# Patient Record
Sex: Male | Born: 1935 | Race: White | Hispanic: No | Marital: Married | Smoking: Former smoker
Health system: Southern US, Community
[De-identification: ages and names within clinical notes are randomized; demographics above are authoritative.]

## PROBLEM LIST (undated history)

## (undated) DIAGNOSIS — E7800 Pure hypercholesterolemia, unspecified: Secondary | ICD-10-CM

## (undated) DIAGNOSIS — G4752 REM sleep behavior disorder: Secondary | ICD-10-CM

## (undated) DIAGNOSIS — G20A1 Parkinson's disease without dyskinesia, without mention of fluctuations: Secondary | ICD-10-CM

## (undated) DIAGNOSIS — J449 Chronic obstructive pulmonary disease, unspecified: Secondary | ICD-10-CM

## (undated) DIAGNOSIS — N400 Enlarged prostate without lower urinary tract symptoms: Secondary | ICD-10-CM

## (undated) DIAGNOSIS — I10 Essential (primary) hypertension: Secondary | ICD-10-CM

## (undated) DIAGNOSIS — J4489 Other specified chronic obstructive pulmonary disease: Secondary | ICD-10-CM

## (undated) DIAGNOSIS — R413 Other amnesia: Secondary | ICD-10-CM

## (undated) DIAGNOSIS — Z960 Presence of urogenital implants: Secondary | ICD-10-CM

## (undated) DIAGNOSIS — Z978 Presence of other specified devices: Secondary | ICD-10-CM

## (undated) DIAGNOSIS — R0602 Shortness of breath: Secondary | ICD-10-CM

## (undated) DIAGNOSIS — G20 Parkinson's disease: Secondary | ICD-10-CM

## (undated) DIAGNOSIS — K219 Gastro-esophageal reflux disease without esophagitis: Secondary | ICD-10-CM

## (undated) DIAGNOSIS — E782 Mixed hyperlipidemia: Secondary | ICD-10-CM

## (undated) DIAGNOSIS — E669 Obesity, unspecified: Secondary | ICD-10-CM

## (undated) HISTORY — DX: Parkinson's disease without dyskinesia, without mention of fluctuations: G20.A1

## (undated) HISTORY — DX: Parkinson's disease: G20

## (undated) HISTORY — DX: REM sleep behavior disorder: G47.52

## (undated) HISTORY — DX: Obesity, unspecified: E66.9

## (undated) HISTORY — DX: Benign prostatic hyperplasia without lower urinary tract symptoms: N40.0

## (undated) HISTORY — DX: Gastro-esophageal reflux disease without esophagitis: K21.9

## (undated) HISTORY — PX: TONSILLECTOMY: SUR1361

## (undated) HISTORY — DX: Pure hypercholesterolemia, unspecified: E78.00

## (undated) HISTORY — DX: Chronic obstructive pulmonary disease, unspecified: J44.9

## (undated) HISTORY — DX: Mixed hyperlipidemia: E78.2

## (undated) HISTORY — DX: Other specified chronic obstructive pulmonary disease: J44.89

## (undated) HISTORY — DX: Essential (primary) hypertension: I10

## (undated) HISTORY — PX: HEMORRHOID SURGERY: SHX153

## (undated) HISTORY — DX: Other amnesia: R41.3

---

## 2000-11-13 ENCOUNTER — Encounter: Admission: RE | Admit: 2000-11-13 | Discharge: 2000-11-13 | Payer: Self-pay | Admitting: Family Medicine

## 2000-11-13 ENCOUNTER — Encounter: Payer: Self-pay | Admitting: Family Medicine

## 2001-11-12 ENCOUNTER — Encounter: Payer: Self-pay | Admitting: Family Medicine

## 2001-11-12 ENCOUNTER — Encounter: Admission: RE | Admit: 2001-11-12 | Discharge: 2001-11-12 | Payer: Self-pay | Admitting: Family Medicine

## 2003-07-28 ENCOUNTER — Encounter: Admission: RE | Admit: 2003-07-28 | Discharge: 2003-07-28 | Payer: Self-pay | Admitting: Family Medicine

## 2004-07-05 ENCOUNTER — Ambulatory Visit: Payer: Self-pay | Admitting: Internal Medicine

## 2004-07-20 ENCOUNTER — Ambulatory Visit: Payer: Self-pay | Admitting: Pulmonary Disease

## 2004-07-30 ENCOUNTER — Ambulatory Visit: Payer: Self-pay | Admitting: Pulmonary Disease

## 2004-08-29 ENCOUNTER — Ambulatory Visit: Payer: Self-pay | Admitting: Pulmonary Disease

## 2004-11-22 ENCOUNTER — Ambulatory Visit: Payer: Self-pay | Admitting: Family Medicine

## 2004-12-06 ENCOUNTER — Ambulatory Visit: Payer: Self-pay | Admitting: Critical Care Medicine

## 2004-12-14 ENCOUNTER — Ambulatory Visit: Payer: Self-pay | Admitting: Internal Medicine

## 2004-12-18 ENCOUNTER — Ambulatory Visit: Payer: Self-pay | Admitting: Gastroenterology

## 2005-01-02 ENCOUNTER — Ambulatory Visit: Payer: Self-pay | Admitting: Gastroenterology

## 2005-01-02 ENCOUNTER — Encounter (INDEPENDENT_AMBULATORY_CARE_PROVIDER_SITE_OTHER): Payer: Self-pay | Admitting: *Deleted

## 2005-01-03 ENCOUNTER — Ambulatory Visit: Payer: Self-pay | Admitting: Pulmonary Disease

## 2005-01-17 ENCOUNTER — Ambulatory Visit: Payer: Self-pay | Admitting: Pulmonary Disease

## 2005-01-25 ENCOUNTER — Ambulatory Visit: Payer: Self-pay | Admitting: Family Medicine

## 2005-01-29 ENCOUNTER — Ambulatory Visit: Payer: Self-pay | Admitting: Pulmonary Disease

## 2005-03-01 ENCOUNTER — Ambulatory Visit: Payer: Self-pay | Admitting: Pulmonary Disease

## 2005-07-12 ENCOUNTER — Ambulatory Visit: Payer: Self-pay | Admitting: Pulmonary Disease

## 2005-08-13 ENCOUNTER — Ambulatory Visit: Payer: Self-pay | Admitting: Internal Medicine

## 2005-08-27 ENCOUNTER — Ambulatory Visit: Payer: Self-pay | Admitting: Pulmonary Disease

## 2005-10-29 ENCOUNTER — Ambulatory Visit: Payer: Self-pay | Admitting: Pulmonary Disease

## 2005-12-05 ENCOUNTER — Ambulatory Visit: Payer: Self-pay | Admitting: Internal Medicine

## 2005-12-06 ENCOUNTER — Ambulatory Visit: Payer: Self-pay | Admitting: Internal Medicine

## 2005-12-19 ENCOUNTER — Encounter: Admission: RE | Admit: 2005-12-19 | Discharge: 2005-12-19 | Payer: Self-pay | Admitting: Family Medicine

## 2005-12-19 ENCOUNTER — Ambulatory Visit: Payer: Self-pay | Admitting: Family Medicine

## 2006-01-03 ENCOUNTER — Ambulatory Visit: Payer: Self-pay | Admitting: Critical Care Medicine

## 2006-01-28 ENCOUNTER — Ambulatory Visit: Payer: Self-pay | Admitting: Family Medicine

## 2006-02-05 ENCOUNTER — Encounter: Admission: RE | Admit: 2006-02-05 | Discharge: 2006-02-05 | Payer: Self-pay | Admitting: Neurology

## 2006-06-10 ENCOUNTER — Ambulatory Visit: Payer: Self-pay | Admitting: Internal Medicine

## 2006-06-28 ENCOUNTER — Emergency Department (HOSPITAL_COMMUNITY): Admission: EM | Admit: 2006-06-28 | Discharge: 2006-06-28 | Payer: Self-pay | Admitting: Emergency Medicine

## 2006-07-05 ENCOUNTER — Emergency Department (HOSPITAL_COMMUNITY): Admission: EM | Admit: 2006-07-05 | Discharge: 2006-07-05 | Payer: Self-pay | Admitting: Emergency Medicine

## 2006-07-07 ENCOUNTER — Ambulatory Visit: Payer: Self-pay | Admitting: Internal Medicine

## 2006-07-14 ENCOUNTER — Ambulatory Visit: Payer: Self-pay | Admitting: Internal Medicine

## 2006-07-30 ENCOUNTER — Ambulatory Visit: Payer: Self-pay | Admitting: Pulmonary Disease

## 2006-09-10 ENCOUNTER — Ambulatory Visit: Payer: Self-pay | Admitting: Internal Medicine

## 2006-12-11 DIAGNOSIS — J449 Chronic obstructive pulmonary disease, unspecified: Secondary | ICD-10-CM

## 2006-12-11 DIAGNOSIS — N400 Enlarged prostate without lower urinary tract symptoms: Secondary | ICD-10-CM | POA: Insufficient documentation

## 2006-12-11 DIAGNOSIS — J45909 Unspecified asthma, uncomplicated: Secondary | ICD-10-CM | POA: Insufficient documentation

## 2007-01-14 ENCOUNTER — Ambulatory Visit: Payer: Self-pay | Admitting: Family Medicine

## 2007-01-14 DIAGNOSIS — E782 Mixed hyperlipidemia: Secondary | ICD-10-CM

## 2007-01-14 DIAGNOSIS — G20 Parkinson's disease: Secondary | ICD-10-CM | POA: Insufficient documentation

## 2007-01-14 DIAGNOSIS — I10 Essential (primary) hypertension: Secondary | ICD-10-CM | POA: Insufficient documentation

## 2007-01-14 LAB — CONVERTED CEMR LAB
Bilirubin, Direct: 0.2 mg/dL (ref 0.0–0.3)
Eosinophils Absolute: 0.1 10*3/uL (ref 0.0–0.6)
Eosinophils Relative: 1.2 % (ref 0.0–5.0)
GFR calc Af Amer: 76 mL/min
GFR calc non Af Amer: 62 mL/min
Glucose, Bld: 109 mg/dL — ABNORMAL HIGH (ref 70–99)
HCT: 47.5 % (ref 39.0–52.0)
Lymphocytes Relative: 15.5 % (ref 12.0–46.0)
MCV: 96.9 fL (ref 78.0–100.0)
Monocytes Absolute: 0.4 10*3/uL (ref 0.2–0.7)
Neutro Abs: 5.2 10*3/uL (ref 1.4–7.7)
Neutrophils Relative %: 76.9 % (ref 43.0–77.0)
PSA: 0.59 ng/mL (ref 0.10–4.00)
Platelets: 183 10*3/uL (ref 150–400)
Potassium: 4.5 meq/L (ref 3.5–5.1)
Sodium: 146 meq/L — ABNORMAL HIGH (ref 135–145)
Triglycerides: 92 mg/dL (ref 0–149)
WBC: 6.8 10*3/uL (ref 4.5–10.5)

## 2007-02-21 DIAGNOSIS — K219 Gastro-esophageal reflux disease without esophagitis: Secondary | ICD-10-CM | POA: Insufficient documentation

## 2007-02-21 DIAGNOSIS — E7800 Pure hypercholesterolemia, unspecified: Secondary | ICD-10-CM | POA: Insufficient documentation

## 2007-03-09 ENCOUNTER — Telehealth: Payer: Self-pay | Admitting: Family Medicine

## 2007-03-12 ENCOUNTER — Telehealth: Payer: Self-pay | Admitting: Family Medicine

## 2007-03-12 ENCOUNTER — Telehealth: Payer: Self-pay | Admitting: Internal Medicine

## 2007-08-19 ENCOUNTER — Encounter: Payer: Self-pay | Admitting: Pulmonary Disease

## 2007-08-19 ENCOUNTER — Ambulatory Visit: Payer: Self-pay | Admitting: Internal Medicine

## 2007-09-02 ENCOUNTER — Telehealth: Payer: Self-pay | Admitting: Family Medicine

## 2007-09-02 ENCOUNTER — Telehealth (INDEPENDENT_AMBULATORY_CARE_PROVIDER_SITE_OTHER): Payer: Self-pay | Admitting: *Deleted

## 2007-09-03 ENCOUNTER — Telehealth: Payer: Self-pay | Admitting: Internal Medicine

## 2007-09-05 ENCOUNTER — Encounter: Payer: Self-pay | Admitting: Internal Medicine

## 2007-11-11 ENCOUNTER — Ambulatory Visit: Payer: Self-pay | Admitting: Internal Medicine

## 2008-01-25 ENCOUNTER — Telehealth (INDEPENDENT_AMBULATORY_CARE_PROVIDER_SITE_OTHER): Payer: Self-pay | Admitting: *Deleted

## 2008-02-02 ENCOUNTER — Encounter: Payer: Self-pay | Admitting: Family Medicine

## 2008-02-03 ENCOUNTER — Ambulatory Visit: Payer: Self-pay | Admitting: Family Medicine

## 2008-02-03 LAB — CONVERTED CEMR LAB
Bilirubin Urine: NEGATIVE
Glucose, Urine, Semiquant: NEGATIVE
WBC Urine, dipstick: NEGATIVE
pH: 7.5

## 2008-02-04 LAB — CONVERTED CEMR LAB
ALT: 12 units/L (ref 0–53)
AST: 24 units/L (ref 0–37)
Albumin: 4 g/dL (ref 3.5–5.2)
BUN: 16 mg/dL (ref 6–23)
Basophils Absolute: 0 10*3/uL (ref 0.0–0.1)
Basophils Relative: 0.1 % (ref 0.0–3.0)
CO2: 36 meq/L — ABNORMAL HIGH (ref 19–32)
Calcium: 9.9 mg/dL (ref 8.4–10.5)
Chloride: 102 meq/L (ref 96–112)
Cholesterol: 167 mg/dL (ref 0–200)
Creatinine, Ser: 1.1 mg/dL (ref 0.4–1.5)
Eosinophils Relative: 1.1 % (ref 0.0–5.0)
Glucose, Bld: 106 mg/dL — ABNORMAL HIGH (ref 70–99)
Hemoglobin: 17.1 g/dL — ABNORMAL HIGH (ref 13.0–17.0)
LDL Cholesterol: 102 mg/dL — ABNORMAL HIGH (ref 0–99)
Lymphocytes Relative: 14.2 % (ref 12.0–46.0)
MCHC: 34.6 g/dL (ref 30.0–36.0)
MCV: 96.1 fL (ref 78.0–100.0)
Neutro Abs: 5.3 10*3/uL (ref 1.4–7.7)
PSA: 0.93 ng/mL (ref 0.10–4.00)
RBC: 5.13 M/uL (ref 4.22–5.81)
TSH: 3.05 microintl units/mL (ref 0.35–5.50)
Total Bilirubin: 1.4 mg/dL — ABNORMAL HIGH (ref 0.3–1.2)
Total Protein: 6.8 g/dL (ref 6.0–8.3)
WBC: 6.8 10*3/uL (ref 4.5–10.5)

## 2008-02-10 ENCOUNTER — Telehealth: Payer: Self-pay | Admitting: Internal Medicine

## 2008-03-02 ENCOUNTER — Telehealth: Payer: Self-pay | Admitting: *Deleted

## 2008-05-02 ENCOUNTER — Telehealth: Payer: Self-pay | Admitting: *Deleted

## 2008-09-14 ENCOUNTER — Telehealth: Payer: Self-pay | Admitting: Family Medicine

## 2008-09-14 ENCOUNTER — Telehealth (INDEPENDENT_AMBULATORY_CARE_PROVIDER_SITE_OTHER): Payer: Self-pay | Admitting: *Deleted

## 2008-09-27 ENCOUNTER — Telehealth (INDEPENDENT_AMBULATORY_CARE_PROVIDER_SITE_OTHER): Payer: Self-pay | Admitting: *Deleted

## 2008-09-29 ENCOUNTER — Telehealth (INDEPENDENT_AMBULATORY_CARE_PROVIDER_SITE_OTHER): Payer: Self-pay | Admitting: *Deleted

## 2008-10-21 ENCOUNTER — Ambulatory Visit: Payer: Self-pay | Admitting: Internal Medicine

## 2009-01-03 ENCOUNTER — Telehealth: Payer: Self-pay | Admitting: Family Medicine

## 2009-01-12 ENCOUNTER — Encounter: Payer: Self-pay | Admitting: *Deleted

## 2009-01-12 ENCOUNTER — Telehealth: Payer: Self-pay | Admitting: Internal Medicine

## 2009-01-17 ENCOUNTER — Telehealth (INDEPENDENT_AMBULATORY_CARE_PROVIDER_SITE_OTHER): Payer: Self-pay | Admitting: *Deleted

## 2009-02-23 ENCOUNTER — Ambulatory Visit: Payer: Self-pay | Admitting: Family Medicine

## 2009-03-06 ENCOUNTER — Telehealth: Payer: Self-pay | Admitting: Family Medicine

## 2009-03-30 ENCOUNTER — Ambulatory Visit: Payer: Self-pay | Admitting: Family Medicine

## 2009-04-28 ENCOUNTER — Encounter: Payer: Self-pay | Admitting: Adult Health

## 2009-05-26 ENCOUNTER — Encounter: Payer: Self-pay | Admitting: Internal Medicine

## 2009-06-07 ENCOUNTER — Ambulatory Visit: Payer: Self-pay | Admitting: Internal Medicine

## 2009-06-07 DIAGNOSIS — J9610 Chronic respiratory failure, unspecified whether with hypoxia or hypercapnia: Secondary | ICD-10-CM

## 2009-06-08 ENCOUNTER — Encounter: Payer: Self-pay | Admitting: Internal Medicine

## 2009-06-08 ENCOUNTER — Telehealth: Payer: Self-pay | Admitting: Internal Medicine

## 2009-06-15 ENCOUNTER — Encounter: Payer: Self-pay | Admitting: Internal Medicine

## 2009-07-05 ENCOUNTER — Encounter: Payer: Self-pay | Admitting: Internal Medicine

## 2009-07-14 ENCOUNTER — Encounter: Payer: Self-pay | Admitting: Internal Medicine

## 2009-08-30 ENCOUNTER — Encounter: Payer: Self-pay | Admitting: Internal Medicine

## 2009-08-31 ENCOUNTER — Ambulatory Visit: Payer: Self-pay | Admitting: Internal Medicine

## 2009-11-06 ENCOUNTER — Telehealth: Payer: Self-pay | Admitting: *Deleted

## 2010-02-27 ENCOUNTER — Encounter: Payer: Self-pay | Admitting: Family Medicine

## 2010-02-27 ENCOUNTER — Ambulatory Visit: Payer: Self-pay | Admitting: Family Medicine

## 2010-02-27 LAB — CONVERTED CEMR LAB
BUN: 21 mg/dL (ref 6–23)
Basophils Absolute: 0.1 10*3/uL (ref 0.0–0.1)
Basophils Relative: 0.8 % (ref 0.0–3.0)
Bilirubin, Direct: 0.1 mg/dL (ref 0.0–0.3)
Blood in Urine, dipstick: NEGATIVE
CO2: 33 meq/L — ABNORMAL HIGH (ref 19–32)
Chloride: 99 meq/L (ref 96–112)
Creatinine, Ser: 1 mg/dL (ref 0.4–1.5)
Eosinophils Absolute: 0.1 10*3/uL (ref 0.0–0.7)
Glucose, Bld: 94 mg/dL (ref 70–99)
MCHC: 33.8 g/dL (ref 30.0–36.0)
MCV: 98.5 fL (ref 78.0–100.0)
Monocytes Absolute: 0.5 10*3/uL (ref 0.1–1.0)
Neutrophils Relative %: 76.2 % (ref 43.0–77.0)
Nitrite: NEGATIVE
Platelets: 168 10*3/uL (ref 150.0–400.0)
Protein, U semiquant: NEGATIVE
RBC: 4.83 M/uL (ref 4.22–5.81)
RDW: 13.6 % (ref 11.5–14.6)
Total Protein: 6.4 g/dL (ref 6.0–8.3)
Urobilinogen, UA: 0.2
WBC Urine, dipstick: NEGATIVE

## 2010-03-05 ENCOUNTER — Ambulatory Visit: Payer: Self-pay | Admitting: Internal Medicine

## 2010-03-19 ENCOUNTER — Telehealth (INDEPENDENT_AMBULATORY_CARE_PROVIDER_SITE_OTHER): Payer: Self-pay | Admitting: *Deleted

## 2010-03-26 ENCOUNTER — Telehealth (INDEPENDENT_AMBULATORY_CARE_PROVIDER_SITE_OTHER): Payer: Self-pay | Admitting: *Deleted

## 2010-04-18 ENCOUNTER — Telehealth (INDEPENDENT_AMBULATORY_CARE_PROVIDER_SITE_OTHER): Payer: Self-pay | Admitting: *Deleted

## 2010-05-20 LAB — CONVERTED CEMR LAB
ALT: 5 units/L (ref 0–53)
Albumin: 3.9 g/dL (ref 3.5–5.2)
Alkaline Phosphatase: 69 units/L (ref 39–117)
BUN: 20 mg/dL (ref 6–23)
Basophils Absolute: 0 10*3/uL (ref 0.0–0.1)
Basophils Relative: 0.1 % (ref 0.0–3.0)
Bilirubin, Direct: 0 mg/dL (ref 0.0–0.3)
Chloride: 100 meq/L (ref 96–112)
Cholesterol: 167 mg/dL (ref 0–200)
Creatinine, Ser: 1 mg/dL (ref 0.4–1.5)
Eosinophils Absolute: 0.1 10*3/uL (ref 0.0–0.7)
GFR calc non Af Amer: 76.44 mL/min (ref >60)
Lymphocytes Relative: 16.4 % (ref 12.0–46.0)
MCHC: 34.4 g/dL (ref 30.0–36.0)
MCV: 99.2 fL (ref 78.0–100.0)
Monocytes Absolute: 0.4 10*3/uL (ref 0.1–1.0)
Neutrophils Relative %: 75.8 % (ref 43.0–77.0)
Nitrite: NEGATIVE
Potassium: 4.1 meq/L (ref 3.5–5.1)
RBC: 4.72 M/uL (ref 4.22–5.81)
RDW: 12.6 % (ref 11.5–14.6)
Specific Gravity, Urine: 1.02
Total Protein: 6.8 g/dL (ref 6.0–8.3)
Triglycerides: 108 mg/dL (ref 0.0–149.0)
Urobilinogen, UA: 1
VLDL: 21.6 mg/dL (ref 0.0–40.0)
WBC Urine, dipstick: NEGATIVE

## 2010-05-22 NOTE — Miscellaneous (Signed)
Summary: Orders Update  Clinical Lists Changes  Orders: Added new Test order of T-2 View CXR (71020TC) - Signed 

## 2010-05-22 NOTE — Procedures (Signed)
Summary: Oximetry/ ok on 2lpm  Oximetry/Oxygen Qualifying Services   Imported By: Sherian Rein 06/20/2009 14:50:39  _____________________________________________________________________  External Attachment:    Type:   Image     Comment:   External Document

## 2010-05-22 NOTE — Progress Notes (Signed)
Summary: Spiriva Rx  Phone Note Other Incoming   Caller: [name] - wife Summary of Call: Mary called requesting 3 month supply spiriva rx sent to [facility].  Rx sent -- [facility] aware.   Initial call taken by: Gweneth Dimitri RN,  March 26, 2010 1:52 PM    Prescriptions: SPIRIVA HANDIHALER 18 MCG  CAPS (TIOTROPIUM BROMIDE MONOHYDRATE) Inhale contents of 1 capsule once a day  #90 x 3   Entered by:   Gweneth Dimitri RN   Authorized by:   [name] MD   Signed by:   Gweneth Dimitri RN on 03/26/2010   Method used:   Faxed to ...       [facility] Environmental education officer)       [address] [address]       [place], [place]  [address]       Ph: [phone]       Fax: [phone]   RxID:   [id number]

## 2010-05-22 NOTE — Letter (Signed)
Summary: CMN/[facility]  CMN/[facility]   Imported By: [place] 07/11/2009 10:47:29  _____________________________________________________________________  External Attachment:    Type:   Image     Comment:   External Document

## 2010-05-22 NOTE — Progress Notes (Signed)
Summary: fax  Phone Note From Pharmacy Call back at [phone] ex [phone]   Caller: [facility] jodi Call For: [name]  Summary of Call: calling about fax sent on 02/03 for pt to have over night oximetry Initial call taken by: Rickard Patience,  June 08, 2009 10:29 AM  Follow-up for Phone Call        [facility] states they never recieved fax, order is scanned in under 05/26/09 so I refaxed it to [phone] attn jodi. [facility] is aware. Carron Curie CMA  June 08, 2009 11:12 AM

## 2010-05-22 NOTE — Assessment & Plan Note (Signed)
Summary: Pulmonary/ ext summary f/u ov   Primary Provider/Referring Provider:  Tawanna Cooler  CC:  3 month follow up with PFTs.  Pt states breathing is doing "ok" unless he is walking up a hill.  Denies cough.  No complaints. .  History of Present Illness: 77  yowm quit smoking 1980 COPD with an FEV1  42% predicted with a ratio of 38% December 05, 2005,   maintained on Brovana and Pulmicort combination with Spiriva.  He says that he can do pretty much anything he wants to so long as he paces himself  oxygen dependent at bedtime  since 2008.  baseline = sob from mb back to house without stopping but once gets to house sit down because it's  uphill but can go 10-15 min flat without stopping.  October 21, 2008 ov no change in activity tolerance and sleeping ok on 02 o/w doesn't wear it. rec xopenex but no change in ex tolerance and recovery time so didn't continue it prn  June 07, 2009 Followup.  Pt needs to re-qualify for  noct o2   Aug 31, 2009 3 month follow up with PFTs.  Pt states breathing is doing "ok" unless he is walking up a hill.  Denies cough.  No complaints.     Current Medications (verified): 1)  Potassium Chloride Crys Cr 20 Meq Tbcr (Potassium Chloride Crys Cr) .... Take Two Tabs By Mouth Once Daily 2)  Tenoretic 50 50-25 Mg  Tabs (Atenolol-Chlorthalidone) .... 1/2 Once Daily 3)  Pravastatin Sodium 20 Mg  Tabs (Pravastatin Sodium) .... Take One Tab By Mouth At Bedtime 4)  Bayer Aspirin 325 Mg  Tabs (Aspirin) .... Take 1 Tablet By Mouth Once A Day 5)  Spiriva Handihaler 18 Mcg  Caps (Tiotropium Bromide Monohydrate) .... Inhale Contents of 1 Capsule Once A Day 6)  Brovana 15 Mcg/36ml  Nebu (Arformoterol Tartrate) .... Use As Directed Two Times A Day 7)  Pulmicort 0.5 Mg/34ml  Susp (Budesonide (Inhalation)) .... Use As Directed Two Times A Day 8)  Carbidopa-Levodopa Cr 50-200 Mg  Tbcr (Carbidopa-Levodopa) .... Take 1 Tablet By Mouth Five Times A Day 9)  Prilosec 20 Mg  Cpdr  (Omeprazole) .... Take  One 30-60 Min Before First Meal of The Day 10)  Cialis 20 Mg  Tabs (Tadalafil) .... Take As Directed As Needed 11)  Mucinex Dm 30-600 Mg  Tb12 (Dextromethorphan-Guaifenesin) .... Take One To Two Every 12 Hours As Needed 12)  Oxygen .... 2l At Bedtime 13)  Centrum Silver  Tabs (Multiple Vitamins-Minerals) .... Once Daily 14)  Xopenex Hfa 45 Mcg/act Aero (Levalbuterol Tartrate) .... 2 Puffs Every 4 Hours If Needed  Allergies (verified): No Known Drug Allergies  Past History:  Past Medical History: GERD (ICD-530.81) HYPERCHOLESTEROLEMIA (ICD-272.0) PARKINSON'S DISEASE (ICD-332.0) HYPERLIPIDEMIA, MIXED (ICD-272.2) HYPERTENSION (ICD-401.9) HYPERLIPIDEMIA (ICD-272.4) HEALTH SCREENING (ICD-V70.0) BENIGN PROSTATIC HYPERTROPHY (ICD-600.00) Obesity  5/ 10"    - Target ideal body wt = 181 COPD (ICD-496)   - PFT's 11/2005  FEV1 42% with ratio 38% with no improvement after bdilators with DLCO 73%   - PFT's 08/31/09 FEV1 47% with ratio 32% with no better after B2 and DLC070   - 09/05/07 overnight pulse ox on ra showed 1:46 min with sat <88   - HFA 25> 50 %  October 21, 2008    - Repeat overnight sat ordered June 07, 2009 >> adequate sats on 2lpm (5 min < 89)     Vital Signs:  Patient profile:  77 year old male Height:      68 inches Weight:      206 pounds BMI:     31.44 O2 Sat:      91 % on Room air Temp:     98.5 degrees F oral Pulse rate:   55 / minute BP sitting:   110 / 80  (right arm) Cuff size:   regular  Vitals Entered By: Gweneth Dimitri RN (Aug 31, 2009 2:07 PM)  O2 Flow:  Room air CC: 3 month follow up with PFTs.  Pt states breathing is doing "ok" unless he is walking up a hill.  Denies cough.  No complaints.  Comments Medications reviewed with patient Daytime contact number verified with patient. Gweneth Dimitri RN  Aug 31, 2009 2:07 PM    Physical Exam  Additional Exam:  Obese white male with mild resting tremor of the right arm and hand with  obvious moderate potbelly contour abdomen and mild hoarseness wt  202 October 21, 2008 > 206 June 07, 2009 > 206 Aug 31, 2009  HEENT mild turbinate edema.  Oropharynx no thrush or excess pnd or cobblestoning.  No JVD or cervical adenopathy. Mild accessory muscle hypertrophy. Trachea midline, nl thryroid. Chest was hyperinflated by percussion with diminished breath sounds and marked increased exp time without wheeze. Hoover sign positive onset of inspiration. Regular rate and rhythm without murmur gallop or rub or increase P2. No edema. Decrease s1s2 Abd: no hsm, nl excursion. Ext warm without  cyanosis or clubbing.   CXR  Procedure date:  08/31/2009  Findings:      COPD/emphysema.  No acute cardiopulmonary disease.  Stable since August, 2007.  Impression & Recommendations:  Problem # 1:  COPD (ICD-496) GOLD III well compensated, no change in rx needed.  Declined rehab.  I had an extended discussion with the patient today lasting 15 to 20 minutes of a 25 minute visit on the following issues:   Each maintenance medication was reviewed in detail including most importantly the difference between maintenance prns and under what circumstances the prns are to be used.  In addition, these two groups (for which the patient should keep up with refills) were distinguished from a third group :  meds that are used only short term with the intent to complete a course of therapy and then not refill them.  The med list was then fully reconciled and reorganized to reflect this important distinction.   Other Orders: Est. Patient Level IV (25366) T-2 View CXR (71020TC)  Patient Instructions: 1)  you have moderate COPD that is considered stage II or early Stage III  (Stage IV is the worst) and you're better than you were in 2007 2)  Therefore work harder to maintain conditioning and loose some weight. 3)  Weight control is simply a matter of calorie balance which needs to be tilted in your favor by eating  less and exercising more.  To get the most out of exercise, you need to be continuously aware that you are short of breath, but never out of breath, for 30 minutes daily. As you improve, it will actually be easier for you to do the same amount in  30 minutes so always push to the level where you are short of breath.  If this does not result in gradual weight reduction,  I recommend  a nutritionist for a food diary.   CXR  Procedure date:  08/31/2009  Findings:      COPD/emphysema.  No acute cardiopulmonary disease.  Stable since August, 2007.

## 2010-05-22 NOTE — Assessment & Plan Note (Signed)
Summary: Pulmonary/ ext summary ov to requalify for 02   Primary Provider/Referring Provider:  Tawanna Cooler  CC:  Followup.  Pt needs to re-qualify for o2 today.  Pt denies any new complaints today.Marland Kitchen  History of Present Illness: 68  yowm quit smoking 1980 COPD with an FEV1  42% predicted with a ratio of 38% documented on December 05, 2005, and no improvement after bronchodilators.  maintained on Brovana and Pulmicort combination with Spiriva.  He says that he can do pretty much anything he wants to so long as he paces himself. no noct spells.  no exacerbations on this regimen since 2008 but oxygen dependent at bedtime now since 2008, no significant cough or a.m. congestion.  baseline = sob from mb back to house without stopping but once gets to house sit down because it's  uphill but can go 10-15 min flat without stopping.  October 21, 2008 ov no change in activity tolerance and sleeping ok on 02 o/w doesn't wear it. rec xopenex but no change in ex tolerance and recovery time so didn't time.    June 07, 2009 Followup.  Pt needs to re-qualify for o2 today. No change in activity tolerance which remains poor.  Pt denies any significant sore throat, dysphagia, itching, sneezing,  nasal congestion or excess secretions,  fever, chills, sweats, unintended wt loss, pleuritic or exertional cp, hempoptysis, change in activity tolerance  orthopnea pnd or leg swelling.  Pt also denies any obvious fluctuation in symptoms with weather or environmental change or other alleviating or aggravating factors.       Allergies (verified): No Known Drug Allergies  Past History:  Past Medical History: GERD (ICD-530.81) HYPERCHOLESTEROLEMIA (ICD-272.0) PARKINSON'S DISEASE (ICD-332.0) HYPERLIPIDEMIA, MIXED (ICD-272.2) HYPERTENSION (ICD-401.9) HYPERLIPIDEMIA (ICD-272.4) HEALTH SCREENING (ICD-V70.0) BENIGN PROSTATIC HYPERTROPHY (ICD-600.00) COPD (ICD-496)   - PFT's 11/2005  FEV1 42% with ratio 38% with no  improvement after bdilators with DLCO 73%   - 09/05/07 overnight pulse ox on ra showed 1:46 min with sat <88   - HFA 25> 50 %  October 21, 2008    - Repeat overnight sat ordered June 07, 2009  ED    Vital Signs:  Patient profile:   77 year old male Weight:      206 pounds O2 Sat:      93 % on Room air Temp:     97.7 degrees F oral Pulse rate:   70 / minute BP sitting:   110 / 64  (left arm)  Vitals Entered By: Vernie Murders (June 07, 2009 11:23 AM)  O2 Flow:  Room air  Serial Vital Signs/Assessments:  Comments: 11:26 AM Ambulatory Pulse Oximetry  Resting; HR__64___    02 Sat__97%ra___  Lap1 (185 feet)   HR_74____   02 Sat__93%ra___ Lap2 (185 feet)   HR__78___   02 Sat__91%ra___    Lap3 (185 feet)   HR_____   02 Sat_____  ___Test Completed without Difficulty _x__Test Stopped due to: pt c/o SOB   By: Vernie Murders    Physical Exam  Additional Exam:  Obese white male with mild resting tremor of the right arm and hand with obvious moderate potbelly contour abdomen and mild hoarseness wt 195 > 202 October 21, 2008 > 206 June 07, 2009  HEENT mild turbinate edema.  Oropharynx no thrush or excess pnd or cobblestoning.  No JVD or cervical adenopathy. Mild accessory muscle hypertrophy. Trachea midline, nl thryroid. Chest was hyperinflated by percussion with diminished breath sounds and marked increased exp time  without wheeze. Hoover sign positive onset of inspiration. Regular rate and rhythm without murmur gallop or rub or increase P2. No edema. Decrease s1s2 Abd: no hsm, nl excursion. Ext warm without  cyanosis or clubbing.   Impression & Recommendations:  Problem # 1:  COPD (ICD-496) I had an extended discussion with the patient and wife today lasting 15 to 20 minutes of a 25 minute visit on the following issues:   GOLD III with significant deconditioning > limited by desaturation so needs to consider rehab but doesn't think he can do it. No better with xopenex  trial. Try paced exercise. No change in rx.    Problem # 2:  RESPIRATORY FAILURE, CHRONIC (ICD-518.83)  Not clear whether still needs nocturnal 02 but no evidence desaturation with activity so repeat noct 02 sat ra to see if requalifies.  Orders: Est. Patient Level IV (99833)  Medications Added to Medication List This Visit: 1)  Prilosec 20 Mg Cpdr (Omeprazole) .... Take  one 30-60 min before first meal of the day  Other Orders: Misc. Referral (Misc. Ref)  Patient Instructions: 1)  Pace yourself to where you can go 20-30 minutes at a time 2)  Omeprazole should be Take  one 30-60 min before first meal of the day 3)  GERD (REFLUX)  is a common cause of respiratory symptoms. It commonly presents without heartburn and can be treated with medication, but also with lifestyle changes including avoidance of late meals, excessive alcohol, smoking cessation, and avoid fatty foods, chocolate, peppermint, colas, red wine, and acidic juices such as orange juice. NO MINT OR MENTHOL PRODUCTS SO NO COUGH DROPS  4)  USE SUGARLESS CANDY INSTEAD (jolley ranchers)  5)  NO OIL BASED VITAMINS  6)  See Patient Care Coordinator before leaving for overnight saturations  7)  Return to office in 3 months PFT's and CXR

## 2010-05-22 NOTE — Progress Notes (Signed)
Summary: omeprazole  Phone Note Call from Patient Call back at Home Phone [phone]   Caller: [facility] Call For: wert Summary of Call: needs new rx for omeprazole- [facility] Initial call taken by: Tivis Ringer, CNA,  March 19, 2010 4:23 PM  Follow-up for Phone Call        PT AWARE MED SENT TO PHARMACY   Follow-up by: Philipp Deputy CMA,  March 19, 2010 4:45 PM    Prescriptions: PRILOSEC 20 MG  CPDR (OMEPRAZOLE) Take  one 30-60 min before first meal of the day  #90 x 3   Entered by:   Philipp Deputy CMA   Authorized by:   [name] MD   Signed by:   Philipp Deputy CMA on 03/19/2010   Method used:   Faxed to ...       [facility] Environmental education officer)       [address] [address]       [place], [place]  [address]       Ph: [phone]       Fax: [phone]   RxID:   [id number]

## 2010-05-22 NOTE — Letter (Signed)
Summary: CMN for Oxygen/[facility]  CMN for Oxygen/[facility]   Imported By: Sherian Rein 07/11/2009 09:14:22  _____________________________________________________________________  External Attachment:    Type:   Image     Comment:   External Document

## 2010-05-22 NOTE — Miscellaneous (Signed)
Summary: Order for Respiratory Eval/[facility]  Order for Respiratory Eval/[facility]   Imported By: Sherian Rein 05/02/2009 12:45:41  _____________________________________________________________________  External Attachment:    Type:   Image     Comment:   External Document

## 2010-05-22 NOTE — Assessment & Plan Note (Signed)
Summary: Pulmonary/ f/u ov with walking sats ok x 2 laps RA   Primary Provider/Referring Provider:  [name]  CC:  Dyspnea.  History of Present Illness: 77  yowm quit smoking 1980 COPD with an FEV1  42% predicted with a ratio of 38% December 05, 2005,   maintained on Brovana and Pulmicort combination with Spiriva.  He says that he can do pretty much anything he wants to so long as he paces himself  oxygen dependent at bedtime  since 2008.  baseline = sob from mb back to house without stopping but once gets to house sit down because it's  uphill but can go 10-15 min flat without stopping.  October 21, 2008 ov no change in activity tolerance and sleeping ok on 02 o/w doesn't wear it. rec xopenex but no change in ex tolerance and recovery time so didn't continue it prn  June 07, 2009 Followup.  Pt needs to re-qualify for  noct o2   Aug 31, 2009 3 month follow up with PFTs.  Pt states breathing is doing "ok" unless he is walking up a hill.  you have moderate COPD is  early Stage III  (Stage IV is the worst) but  you're better than you were in 2007 Therefore work harder to maintain conditioning and loose some weight. Weight control is key no change in rx with spiriva/ brov and bud.   March 05, 2010 cc doe x walking up to 30 min total around the neighborhood with freqent stops.  no cough or variability.  Pt denies any significant sore throat, dysphagia, itching, sneezing,  nasal congestion or excess secretions,  fever, chills, sweats, unintended wt loss, pleuritic or exertional cp, hempoptysis,  rthopnea pnd or leg swelling.  Pt also denies any obvious fluctuation in symptoms with weather or environmental change or other alleviating or aggravating factors.         Allergies: No Known Drug Allergies  Past History:  Past Medical History: GERD (ICD-530.81) HYPERCHOLESTEROLEMIA (ICD-272.0) PARKINSON'S DISEASE (ICD-332.0) HYPERLIPIDEMIA, MIXED (ICD-272.2) HYPERTENSION  (ICD-401.9) HYPERLIPIDEMIA (ICD-272.4) HEALTH SCREENING (ICD-V70.0) BENIGN PROSTATIC HYPERTROPHY (ICD-600.00) Obesity  5/ 8"    - Target wt  =  190  for BMI < 30 ,   171 for BMI 26  COPD (ICD-496)   - PFT's 11/2005  FEV1 42% with ratio 38% with no improvement after bdilators with DLCO 73%   - PFT's 08/31/09 FEV1 47% with ratio 32% with no better after B2 and DLC0 70   - 09/05/07 overnight pulse ox on ra showed 1:46 min with sat <88   - HFA 25> 50 %  October 21, 2008    - Repeat overnight sat ordered June 07, 2009 >> adequate sats on 2lpm (5 min < 89)   - Walking sats RA  =   ok x 2 laps  March 05, 2010      Vital Signs:  Patient profile:   77 year old male Weight:      198 pounds O2 Sat:      94 % on Room air Temp:     98.4 degrees F oral Pulse rate:   56 / minute BP sitting:   118 / 68  (left arm)  Vitals Entered By: Vernie Murders (March 05, 2010 11:53 AM)  O2 Flow:  Room air  Serial Vital Signs/Assessments:  Comments: 12:24 PM Ambulatory Pulse Oximetry  Resting; HR__56___    02 Sat__95%ra___  Lap1 (185 feet)   HR__67___   02 Sat__93%ra___  Lap2 (185 feet)   HR__69___   02 Sat__92%ra___    Lap3 (185 feet)   HR_____   02 Sat_____  ___Test Completed without Difficulty _x__Test Stopped due to: pt c/o sob   By: Vernie Murders    Physical Exam  Additional Exam:  Obese white male with mild resting tremor of the right arm and hand with obvious moderate potbelly contour abdomen and mild hoarseness wt  202 October 21, 2008 > 206 June 07, 2009 > 206 Aug 31, 2009 > 198 March 05, 2010  HEENT mild turbinate edema.  Oropharynx no thrush or excess pnd or cobblestoning.  No JVD or cervical adenopathy. Mild accessory muscle hypertrophy. Trachea midline, nl thryroid. Chest was hyperinflated by percussion with diminished breath sounds and marked increased exp time without wheeze. Hoover sign positive onset of inspiration. Regular rate and rhythm without murmur gallop or rub  or increase P2. No edema. Decrease s1s2 Abd: no hsm, nl excursion. Ext warm without  cyanosis or clubbing.   Impression & Recommendations:  Problem # 1:  COPD (ICD-496)  GOLD III well compensated, no change in rx needed.  Declined rehab.    Each maintenance medication was reviewed in detail including most importantly the difference between maintenance prns and under what circumstances the prns are to be used.  In addition, these two groups (for which the patient should keep up with refills) were distinguished from a third group :  meds that are used only short term with the intent to complete a course of therapy and then not refill them.  The med list was then fully reconciled and reorganized to reflect this important distinction.   Problem # 2:  RESPIRATORY FAILURE, CHRONIC (ICD-518.83) adequate rx with 02 just at bedtime  Other Orders: Est. Patient Level III (84132) Pulse Oximetry, Ambulatory (44010)  Patient Instructions: 1)  Weight control is simply a matter of calorie balance which needs to be tilted in your favor by eating less and exercising more.  To get the most out of exercise, you need to be continuously aware that you are short of breath, but never out of breath, for 30 minutes daily. As you improve, it will actually be easier for you to do the same amount in  30 minutes so always push to the level where you are short of breath.  If this does not result in gradual weight reduction,  I recommend  a nutritionist for a food diary.  2)  No change in meds 3)  Return to office in 3 months, sooner if needed

## 2010-05-22 NOTE — Assessment & Plan Note (Signed)
Summary: emp--will fast//ccm   Vital Signs:  Patient profile:   77 year old male Height:      68 inches Weight:      199 pounds Temp:     98.2 degrees F oral BP sitting:   120 / 80  (left arm) Cuff size:   regular  Vitals Entered By: Kern Reap CMA Duncan Dull) (February 27, 2010 9:46 AM) CC: wellness exam   Primary Care Provider:  Tawanna Cooler  CC:  wellness exam.  History of Present Illness: [name] is a 77 year old male, who comes in today for Medicare wellness examination because of underlying COPD......... O2 at night, at 2 L......... hyperlipidemia Parkinson's disease, hypertension, BPH.  His medication list was reviewed.  There is no changes except he is not using the Cialis anymore.  He gets routine eye care, dental care, tetanus, 2002, seasonal flu 2011, Pneumovax 2004 in 1999, declines to the shingles.  Here for Medicare AWV:  1.   Risk factors based on Past M, S, F history:...no changes 2.   Physical Activities: walks daily 3.   Depression/mood: good mood.  No depression 4.   Hearing: decreased hearing declines evaluation 5.   ADL's: homebound sedentary cared for by his wife declining short-term memory 6.   Fall Risk: we did not identify 7.   Home Safety: no guns in the house 8.   Height, weight, &visual acuity:height weight, normal vision, unchanged 9.   Counseling: counseled wife about total care 10.   Labs ordered based on risk factors: done today 11.           Referral Coordination........none indicated 12.           Care Plan.......Marland Kitchenreviewed medications with wife 5.            Cognitive Assessment ........Marland Kitchenprogressive dementia  Allergies: No Known Drug Allergies  Past History:  Past medical, surgical, family and social histories (including risk factors) reviewed, and no changes noted (except as noted below).  Past Medical History: Reviewed history from 08/31/2009 and no changes required. GERD (ICD-530.81) HYPERCHOLESTEROLEMIA (ICD-272.0) PARKINSON'S DISEASE  (ICD-332.0) HYPERLIPIDEMIA, MIXED (ICD-272.2) HYPERTENSION (ICD-401.9) HYPERLIPIDEMIA (ICD-272.4) HEALTH SCREENING (ICD-V70.0) BENIGN PROSTATIC HYPERTROPHY (ICD-600.00) Obesity  5/ 10"    - Target ideal body wt = 181 COPD (ICD-496)   - PFT's 11/2005  FEV1 42% with ratio 38% with no improvement after bdilators with DLCO 73%   - PFT's 08/31/09 FEV1 47% with ratio 32% with no better after B2 and DLC070   - 09/05/07 overnight pulse ox on ra showed 1:46 min with sat <88   - HFA 25> 50 %  October 21, 2008    - Repeat overnight sat ordered June 07, 2009 >> adequate sats on 2lpm (5 min < 89)     Past Surgical History: Reviewed history from 12/11/2006 and no changes required. Hemorrhoidectomy  Family History: Reviewed history from 11/11/2007 and no changes required. Family History of Cardiovascular disorder neg resp dz/atopy  Social History: Reviewed history from 11/11/2007 and no changes required. Former Smoker, quit 1980 Alcohol use-yes Drug use-no Retired Regular exercise-yes  Review of Systems      See HPI  Physical Exam  General:  Well-developed,well-nourished,in no acute distress; alert,appropriate and cooperative throughout examination Head:  Normocephalic and atraumatic without obvious abnormalities. No apparent alopecia or balding. Eyes:  No corneal or conjunctival inflammation noted. EOMI. Perrla. Funduscopic exam benign, without hemorrhages, exudates or papilledema. Vision grossly normal. Ears:  External ear exam shows no significant lesions or  deformities.  Otoscopic examination reveals clear canals, tympanic membranes are intact bilaterally without bulging, retraction, inflammation or discharge. Hearing is grossly normal bilaterally. Nose:  External nasal examination shows no deformity or inflammation. Nasal mucosa are pink and moist without lesions or exudates. Mouth:  Oral mucosa and oropharynx without lesions or exudates.  Teeth in good repair. Neck:  No deformities,  masses, or tenderness noted. Chest Wall:  barrel-shaped Breasts:  No masses or gynecomastia noted Lungs:  symmetrical but barely audible breath sounds Heart:  no murmur.  Heart sounds distant because of underlying COPD Abdomen:  Bowel sounds positive,abdomen soft and non-tender without masses, organomegaly or hernias noted. Rectal:  No external abnormalities noted. Normal sphincter tone. No rectal masses or tenderness. Genitalia:  Testes bilaterally descended without nodularity, tenderness or masses. No scrotal masses or lesions. No penis lesions or urethral discharge. Prostate:  no nodules, no asymmetry, and 1+ enlarged.   Msk:  No deformity or scoliosis noted of thoracic or lumbar spine.   Pulses:  R and L carotid,radial,femoral,dorsalis pedis and posterior tibial pulses are full and equal bilaterally Extremities:  No clubbing, cyanosis, edema, or deformity noted with normal full range of motion of all joints.   Neurologic:  oriented x 3 unable to do calculations.  Continue progressive tremor   Impression & Recommendations:  Problem # 1:  GERD (ICD-530.81) Assessment Unchanged  His updated medication list for this problem includes:    Prilosec 20 Mg Cpdr (Omeprazole) .Marland Kitchen... Take  one 30-60 min before first meal of the day  Orders: Venipuncture ([address]) Prescription Created Electronically [phone]) Medicare -1st Annual Wellness Visit [phone]) Urinalysis-dipstick only (Medicare patient) ([id number]) Specimen Handling (56213) TLB-BMP (Basic Metabolic Panel-BMET) (80048-METABOL) TLB-CBC Platelet - w/Differential (85025-CBCD) TLB-Hepatic/Liver Function Pnl (80076-HEPATIC) TLB-TSH (Thyroid Stimulating Hormone) (84443-TSH)  Problem # 2:  HYPERCHOLESTEROLEMIA (ICD-272.0) Assessment: Improved  His updated medication list for this problem includes:    Pravastatin Sodium 20 Mg Tabs (Pravastatin sodium) .Marland Kitchen... Take one tab by mouth at bedtime  Orders: Venipuncture (08657) Prescription Created  Electronically [phone]) Medicare -1st Annual Wellness Visit [phone]) Urinalysis-dipstick only (Medicare patient) [phone]) Specimen Handling (10272) TLB-BMP (Basic Metabolic Panel-BMET) (80048-METABOL) TLB-CBC Platelet - w/Differential (85025-CBCD) TLB-Hepatic/Liver Function Pnl (80076-HEPATIC) TLB-TSH (Thyroid Stimulating Hormone) (84443-TSH)  Problem # 3:  PARKINSON'S DISEASE (ICD-332.0) Assessment: Deteriorated  Orders: Venipuncture (53664) Prescription Created Electronically [phone]) Medicare -1st Annual Wellness Visit [phone]) Urinalysis-dipstick only (Medicare patient) (63875IE) TLB-BMP (Basic Metabolic Panel-BMET) (80048-METABOL) TLB-CBC Platelet - w/Differential (85025-CBCD) TLB-Hepatic/Liver Function Pnl (80076-HEPATIC) TLB-TSH (Thyroid Stimulating Hormone) (84443-TSH)  Problem # 4:  HYPERTENSION (ICD-401.9) Assessment: Improved  His updated medication list for this problem includes:    Tenoretic 50 50-25 Mg Tabs (Atenolol-chlorthalidone) .Marland Kitchen... 1/2 once daily  Orders: Venipuncture (33295) Prescription Created Electronically [phone]) Medicare -1st Annual Wellness Visit [phone]) Urinalysis-dipstick only (Medicare patient) [phone]) EKG w/ Interpretation (93000) TLB-BMP (Basic Metabolic Panel-BMET) (80048-METABOL) TLB-CBC Platelet - w/Differential (85025-CBCD) TLB-Hepatic/Liver Function Pnl (80076-HEPATIC) TLB-TSH (Thyroid Stimulating Hormone) (84443-TSH)  Problem # 5:  HEALTH SCREENING (ICD-V70.0) Assessment: Unchanged  Orders: Venipuncture (32355) Prescription Created Electronically [phone]) Medicare -1st Annual Wellness Visit [phone]) Urinalysis-dipstick only (Medicare patient) (06237SE) Specimen Handling (83151) TLB-BMP (Basic Metabolic Panel-BMET) (80048-METABOL) TLB-CBC Platelet - w/Differential (85025-CBCD) TLB-Hepatic/Liver Function Pnl (80076-HEPATIC) TLB-TSH (Thyroid Stimulating Hormone) (84443-TSH)  Complete Medication List: 1)  Potassium Chloride Crys  Cr 20 Meq Tbcr (Potassium chloride crys cr) .... Take two tabs by mouth once daily 2)  Tenoretic 50 50-25 Mg Tabs (Atenolol-chlorthalidone) .... 1/2 once daily 3)  Pravastatin Sodium 20 Mg Tabs (  Pravastatin sodium) .... Take one tab by mouth at bedtime 4)  Bayer Aspirin 325 Mg Tabs (Aspirin) .... Take 1 tablet by mouth once a day 5)  Spiriva Handihaler 18 Mcg Caps (Tiotropium bromide monohydrate) .... Inhale contents of 1 capsule once a day 6)  Brovana 15 Mcg/78ml Nebu (Arformoterol tartrate) .... Use as directed two times a day 7)  Pulmicort 0.5 Mg/27ml Susp (Budesonide (inhalation)) .... Use as directed two times a day 8)  Carbidopa-levodopa Cr 50-200 Mg Tbcr (Carbidopa-levodopa) .... Take 1 tablet by mouth five times a day 9)  Prilosec 20 Mg Cpdr (Omeprazole) .... Take  one 30-60 min before first meal of the day 10)  Oxygen  .... 2l at bedtime 11)  Centrum Silver Tabs (Multiple vitamins-minerals) .... Once daily 12)  Mucinex Dm 30-600 Mg Tb12 (Dextromethorphan-guaifenesin) .... Take one to two every 12 hours as needed 13)  Xopenex Hfa 45 Mcg/act Aero (Levalbuterol tartrate) .... 2 puffs every 4 hours if needed Prescriptions: CARBIDOPA-LEVODOPA CR 50-200 MG  TBCR (CARBIDOPA-LEVODOPA) Take 1 tablet by mouth five times a day  #500 x 3   Entered and Authorized by:   [name] MD   Signed by:   [name] MD on 02/27/2010   Method used:   Faxed to ...       [facility])       [address] [address]       [place], [place]  [address]       Ph: [phone]       Fax: [phone]   RxID:   [id number] PRAVASTATIN SODIUM 20 MG  TABS (PRAVASTATIN SODIUM) take one tab by mouth at bedtime  #100 x 3   Entered and Authorized by:   [name] MD   Signed by:   [name] MD on 02/27/2010   Method used:   Faxed to ...       [facility])       [address] [address]       [place], [place]  [address]       Ph: [phone]       Fax: [phone]   RxID:    [id number] TENORETIC 50 50-25 MG  TABS (ATENOLOL-CHLORTHALIDONE) 1/2 once daily  #50 x 3   Entered and Authorized by:   [name] MD   Signed by:   [name] MD on 02/27/2010   Method used:   Faxed to ...       [facility])       [address] [address]       [place], [place]  [address]       Ph: [phone]       Fax: [phone]   RxID:   [id number] POTASSIUM CHLORIDE CRYS CR 20 MEQ TBCR (POTASSIUM CHLORIDE CRYS CR) take two tabs by mouth once daily  #200 x 3   Entered and Authorized by:   [name] MD   Signed by:   [name] MD on 02/27/2010   Method used:   Faxed to ...       [facility])       [address] [address]       [place], [place]  [address]       Ph: [phone]       Fax: [phone]   RxID:   [id number]    Orders Added: 1)  Venipuncture [67893] 2)  Prescription Created Electronically [phone] 3)  Medicare -1st Annual Wellness Visit [G0438]  4)  Urinalysis-dipstick only (Medicare patient) [81003QW] 5)  Specimen Handling [99000] 6)  EKG w/ Interpretation [93000] 7)  TLB-BMP (Basic Metabolic Panel-BMET) [80048-METABOL] 8)  TLB-CBC Platelet - w/Differential [85025-CBCD] 9)  TLB-Hepatic/Liver Function Pnl [80076-HEPATIC] 10)  TLB-TSH (Thyroid Stimulating Hormone) [84443-TSH]      Laboratory Results   Urine Tests    Routine Urinalysis   Color: yellow Appearance: Clear Glucose: negative   (Normal Range: Negative) Bilirubin: negative   (Normal Range: Negative) Ketone: trace (5)   (Normal Range: Negative) Spec. Gravity: 1.020   (Normal Range: 1.003-1.035) Blood: negative   (Normal Range: Negative) pH: 7.0   (Normal Range: 5.0-8.0) Protein: negative   (Normal Range: Negative) Urobilinogen: 0.2   (Normal Range: 0-1) Nitrite: negative   (Normal Range: Negative) Leukocyte Esterace: negative   (Normal Range: Negative)    Comments: Rita Ohara  February 27, 2010 2:29 PM

## 2010-05-22 NOTE — Progress Notes (Signed)
Summary: [facility] in [place]. Need 2 wk supply of Potassium  Phone Note From Pharmacy Call back at [phone] [facility]   Caller: [facility] in [place]   - [place] Summary of Call: Pt needs 2 week supply of Potassium (unsure of strength).  Pls call in to [facility] in [place] Initial call taken by: Lucy Antigua,  November 06, 2009 1:23 PM  Follow-up for Phone Call        ok Follow-up by: [name] MD,  November 06, 2009 1:45 PM  Additional Follow-up for Phone Call Additional follow up Details #1::        Phone call completed Additional Follow-up by: Kern Reap CMA Duncan Dull),  November 06, 2009 5:39 PM

## 2010-05-22 NOTE — Letter (Signed)
Summary: CMN for Oxygen/[facility]  CMN for Oxygen/[facility]   Imported By: Sherian Rein 07/18/2009 15:12:59  _____________________________________________________________________  External Attachment:    Type:   Image     Comment:   External Document

## 2010-05-22 NOTE — Miscellaneous (Signed)
Summary: Orders Update pft charges  Clinical Lists Changes  Orders: Added new Service order of Carbon Monoxide diffusing w/capacity (94720) - Signed Added new Service order of Lung Volumes (94240) - Signed Added new Service order of Spirometry (Pre & Post) (94060) - Signed 

## 2010-05-24 NOTE — Progress Notes (Signed)
Summary: prescriptions  Phone Note Call from Patient Call back at Home Phone [phone]   Caller: spouse/[name] Call For: Dr. [name] Summary of Call: patients wife phoned regarding a prescription for [name] he needs his prescriptions for Brovania and Budesonide [facility] she doesn't have the fax number but the phone number is [phone]. Patient can be reached at [phone] Initial call taken by: Vedia Coffer,  April 18, 2010 10:14 AM  Follow-up for Phone Call        Spoke with pt's spouse and notified that rxs for neb meds have [facility].  Follow-up by: Vernie Murders,  April 18, 2010 10:50 AM    Prescriptions: PULMICORT 0.5 MG/2ML  SUSP (BUDESONIDE (INHALATION)) use as directed two times a day  #180 x 3   Entered by:   Vernie Murders   Authorized by:   [name] MD   Signed by:   Vernie Murders on 04/18/2010   Method used:   Faxed to ...       [facility])       [address] [address]       [place], [place]  [address]       Ph: [phone]       Fax: [phone]   RxID:   [id number] BROVANA 15 MCG/2ML  NEBU (ARFORMOTEROL TARTRATE) use as directed two times a day  #180 x 3   Entered by:   Vernie Murders   Authorized by:   [name] MD   Signed by:   Vernie Murders on 04/18/2010   Method used:   Faxed to ...       [facility])       [address] [address]       [place], [place]  [address]       Ph: [phone]       Fax: [phone]   RxID:   [id number]

## 2010-05-25 NOTE — Miscellaneous (Signed)
Summary: Oxygen Order/[facility]  Oxygen Order/[facility]   Imported By: Sherian Rein 06/20/2009 14:52:33  _____________________________________________________________________  External Attachment:    Type:   Image     Comment:   External Document

## 2010-06-07 ENCOUNTER — Ambulatory Visit (INDEPENDENT_AMBULATORY_CARE_PROVIDER_SITE_OTHER): Payer: Medicare Other | Admitting: Internal Medicine

## 2010-06-07 ENCOUNTER — Encounter: Payer: Self-pay | Admitting: Internal Medicine

## 2010-06-07 DIAGNOSIS — J449 Chronic obstructive pulmonary disease, unspecified: Secondary | ICD-10-CM

## 2010-06-13 NOTE — Assessment & Plan Note (Signed)
Summary: Pulmonary/ f/u copd doing well > f/u q 6 months   Primary Provider/Referring Provider:  [name]  CC:  f/u sob same, dry cough, wheezing, and dizzness x 2 days 1 wk. ago.  History of Present Illness: 19  yowm quit smoking 1980 COPD with an FEV1  42% predicted with a ratio of 38% December 05, 2005,   maintained on Brovana and Pulmicort combination with Spiriva.  He says that he can do pretty much anything he wants to so long as he paces himself  oxygen dependent at bedtime since 2008.  baseline = sob from mb back to house without stopping but once gets to house sit down because it's  uphill but can go 10-15 min flat without stopping.  October 21, 2008 ov no change in activity tolerance and sleeping ok on 02 o/w doesn't wear it. rec xopenex but no change in ex tolerance and recovery time so didn't continue it prn  June 07, 2009 Followup.  Pt needs to re-qualify for  noct o2   Aug 31, 2009 3 month follow up with PFTs.  Pt states breathing is doing "ok" unless he is walking up a hill.  you have moderate COPD is  early Stage III  (Stage IV is the worst) but  you're better than you were in 2007 Therefore work harder to maintain conditioning and loose some weight. Weight control is key no change in rx with spiriva/ brov and bud.   March 05, 2010 cc doe x walking up to 30 min total around the neighborhood with freqent stops.    June 07, 2010 f/u sob same,dry cough,wheezing,dizzness x 2 days 1 wk ago but now back to baseline doe and no cough. Pt denies any significant sore throat, dysphagia, itching, sneezing,  nasal congestion or excess secretions,  fever, chills, sweats, unintended wt loss, pleuritic or exertional cp, hempoptysis, change in activity tolerance  orthopnea pnd or leg swelling.        Preventive Screening-Counseling & Management  Alcohol-Tobacco     Smoking Status: quit     Year Quit: 1980  Current Medications (verified): 1)  Potassium Chloride Crys Cr 20 Meq  Tbcr (Potassium Chloride Crys Cr) .... Take Two Tabs By Mouth Once Daily 2)  Tenoretic 50 50-25 Mg  Tabs (Atenolol-Chlorthalidone) .... 1/2 Once Daily 3)  Pravastatin Sodium 20 Mg  Tabs (Pravastatin Sodium) .... Take One Tab By Mouth At Bedtime 4)  Bayer Aspirin 325 Mg  Tabs (Aspirin) .... Take 1 Tablet By Mouth Once A Day 5)  Spiriva Handihaler 18 Mcg  Caps (Tiotropium Bromide Monohydrate) .... Inhale Contents of 1 Capsule Once A Day 6)  Brovana 15 Mcg/62ml  Nebu (Arformoterol Tartrate) .... Use As Directed Two Times A Day 7)  Pulmicort 0.5 Mg/55ml  Susp (Budesonide (Inhalation)) .... Use As Directed Two Times A Day 8)  Carbidopa-Levodopa Cr 50-200 Mg  Tbcr (Carbidopa-Levodopa) .... Take 1 Tablet By Mouth Five Times A Day 9)  Prilosec 20 Mg  Cpdr (Omeprazole) .... Take  One 30-60 Min Before First Meal of The Day 10)  Oxygen .... 2l At Bedtime 11)  Centrum Silver  Tabs (Multiple Vitamins-Minerals) .... Once Daily 12)  Mucinex Dm 30-600 Mg  Tb12 (Dextromethorphan-Guaifenesin) .... Take One To Two Every 12 Hours As Needed 13)  Xopenex Hfa 45 Mcg/act Aero (Levalbuterol Tartrate) .... 2 Puffs Every 4 Hours If Needed  Allergies (verified): No Known Drug Allergies  Past History:  Family History: Last updated: 11/11/2007 Family History of  Cardiovascular disorder neg resp dz/atopy  Social History: Last updated: 11/11/2007 Former Smoker, quit 1980 Alcohol use-yes Drug use-no Retired Regular exercise-yes  Risk Factors: Exercise: yes (01/14/2007)  Risk Factors: Smoking Status: quit (06/07/2010)  Past Medical History: GERD (ICD-530.81) HYPERCHOLESTEROLEMIA (ICD-272.0) PARKINSON'S DISEASE (ICD-332.0) HYPERLIPIDEMIA, MIXED (ICD-272.2) HYPERTENSION (ICD-401.9) HYPERLIPIDEMIA (ICD-272.4) HEALTH SCREENING (ICD-V70.0) BENIGN PROSTATIC HYPERTROPHY (ICD-600.00) Obesity  5/ 8"    - Target wt  =  190  for BMI < 30 ,   171 for BMI 26  COPD (ICD-496)   - PFT's 11/2005  FEV1 42% with ratio 38%  with no improvement after bdilators with DLCO 73%   - PFT's 08/31/09 FEV1 47% with ratio 32% with no better after B2 and DLC0 70   - 09/05/07 overnight pulse ox on ra showed 1:46 min with sat <88   - HFA 25> 50 %  October 21, 2008 so changed to neb brov/budesonide   - DPI 100% p coaching June 07, 2010    - Repeat overnight sat ordered June 07, 2009 >> adequate sats on 2lpm (5 min < 89)   - Walking sats RA  =   ok x 2 laps  March 05, 2010      Vital Signs:  Patient profile:   77 year old male Height:      68 inches Weight:      202 pounds BMI:     30.83 O2 Sat:      94 % on Room air Temp:     97.5 degrees F oral Pulse rate:   56 / minute BP sitting:   130 / 70  (left arm) Cuff size:   large  Vitals Entered By: [name]) (June 07, 2010 11:41 AM)  O2 Flow:  Room air CC: f/u sob same,dry cough,wheezing,dizzness x 2 days 1 wk. ago Is Patient Diabetic? No Comments Medications reviewed with patient ,phone # verifed. [name])  June 07, 2010 11:50 AM    Physical Exam  Additional Exam:  Obese white male with mild resting tremor of the right arm and hand with obvious moderate potbelly contour abdomen and mild hoarseness wt  202 October 21, 2008 > 206 June 07, 2009 > 206 Aug 31, 2009 > 198 March 05, 2010 > 202 June 07, 2010  HEENT mild turbinate edema.  Oropharynx no thrush or excess pnd or cobblestoning.  No JVD or cervical adenopathy. Mild accessory muscle hypertrophy. Trachea midline, nl thryroid. Chest was hyperinflated by percussion with diminished breath sounds and marked increased exp time without wheeze. Hoover sign positive onset of inspiration. Regular rate and rhythm without murmur gallop or rub or increase P2. No edema. Decrease s1s2 Abd: no hsm, nl excursion. Ext warm without  cyanosis or clubbing.   Impression & Recommendations:  Problem # 1:  COPD (ICD-496)  GOLD III well compensated, no change in rx needed.  Declined  rehab.    Each maintenance medication was reviewed in detail including most importantly the difference between maintenance prns and under what circumstances the prns are to be used.  In addition, these two groups (for which the patient should keep up with refills) were distinguished from a third group :  meds that are used only short term with the intent to complete a course of therapy and then not refill them.  The med list was then fully reconciled and reorganized to reflect this important distinction.   I spent extra time with the patient today explaining optimal dpi technique.  This improved to 100% p coaching  Orders: Est. Patient Level IV ([address])  Patient Instructions: 1)  Return to office in 6  months, sooner if needed  2)  Tammy is my NP and can see you here for any flare if I'm not available     Prevention & Chronic Care Immunizations   Influenza vaccine: Historical  (01/11/2009)    Tetanus booster: 04/22/2000: Historical    Pneumococcal vaccine: Historical  (04/22/2002)    H. zoster vaccine: Not documented  Colorectal Screening   Hemoccult: Not documented    Colonoscopy: Not documented  Other Screening   PSA: 0.54  (02/23/2009)   Smoking status: quit  (06/07/2010)  Lipids   Total Cholesterol: 167  (02/23/2009)   LDL: 104  (02/23/2009)   LDL Direct: Not documented   HDL: 41.20  (02/23/2009)   Triglycerides: 108.0  (02/23/2009)    SGOT (AST): 26  (02/27/2010)   SGPT (ALT): 5  (02/27/2010)   Alkaline phosphatase: 66  (02/27/2010)   Total bilirubin: 1.0  (02/27/2010)  Hypertension   Last Blood Pressure: 130 / 70  (06/07/2010)   Serum creatinine: 1.0  (02/27/2010)   Serum potassium 3.9  (02/27/2010)  Self-Management Support :    Hypertension self-management support: Not documented    Lipid self-management support: Not documented

## 2010-09-04 NOTE — Assessment & Plan Note (Signed)
[facility]                             PULMONARY OFFICE NOTE   NAME:[name], [name]                      MRN:          [id number]  DATE:09/10/2006                            DOB:          02-Mar-1927    This is a pulmonary extended summary final followup.   HISTORY:  This is a 77 year old white male, former smoker, with severe  COPD with an FEV1 baseline of only 42% predicted with a ratio of 38%  documented on December 05, 2005, and no improvement after bronchodilators.  He nevertheless had a significant improvement, clinically, after using  prednisone exacerbations which actually has not occurred since he has  been maintained on Brovana and Pulmicort combination with Spiriva.  He  says that he can do pretty much anything he wants to so long as he paces  himself.  He denies any nocturnal cough, fever, chills, sweats, chest  pain or leg swelling.   PHYSICAL EXAMINATION:  He is a robust, pleasant, ambulatory, white male  with minimal hoarseness.  He has normal vital signs.  HEENT:  Unremarkable.  LUNGS:  Clear.  Diminished breath sounds no wheezing.  HEART:  Reveals regular rhythm without murmur, or gallop  or rub.  ABDOMEN:  Soft, benign.  EXTREMITIES:  No cyanosis or calf tenderness.  NEUROLOGIC:  He does have a positive pill roll tight resting tremor.   Oxygen saturation 92% on room air.  I note after his most recent  exacerbation his saturation was 75% on room air and he does have oxygen  to use q.h.s. but is not using it.   IMPRESSION:  1. Severe chronic obstructive pulmonary disease with a baseline      diffusing capacity of 73% predicted which typically correlates with      the absence of hypoxemia. Therefore, I am going to recommend the O2      saturation be repeated sleeping; and if he no longer qualifies for      it; I think that it is fine to stop it.  Otherwise I am going to      continue to encourage him to use it at bedtime.  2. He  does have an apparent asthmatic component evidenced by the      frequent exacerbations that occurred prior to his starting Brovana      and Pulmicort and combination.  Soya asked him to continue this.   I further spent extra time going over each and every one of his  maintenance versus p.r.n. instructions with a specific concept of  thinking left to right in terms of the symptom; that is if he is  having any dyspnea, subjective wheeze, chest tightness, shortness of  breath etcetera.  He should try the albuterol nebulizer q.4 h. p.r.n.  and for cough and congestion he should use the Mucinex DM 1-2 q.12 h.  And if neither of these are working to his satisfaction than he should  call me.   After I finished discussing these issues, the patient's wife said do  you want me to call if he gets a cold.  I replied that regardless of  the mechanism that generates the symptoms, I want him to focus on the  symptoms and not try to I will think the pathophysiology.  in other  words, does not matter whether it is a cold, a virus, flu, or allergies;  if he is having respiratory symptoms as outlined on the instruction  sheet, he should activate his action plan; and then call me if it is  not working.  If there are new problems that develop, then an additional  action plan might need to be considered., but for now we have covered  all the bases, I believe, and adequately; and we will see the patient  back here on a p.r.n. basis.     Charlaine Dalton. [name], MD, [facility]  Electronically Signed    MBW/MedQ  DD: 09/10/2006  DT: 09/10/2006  Job #: [id number]

## 2010-09-07 NOTE — Assessment & Plan Note (Signed)
[facility]                             PULMONARY OFFICE NOTE   [name], [name]                      MRN:          [id number]  DATE:07/30/2006                            DOB:          1926/07/22    HISTORY OF PRESENT ILLNESS:  The patient is a 77 year old white male  patient of Dr. Thurston [name] who has a known history of chronic obstructive  pulmonary disease that presents for a two week followup.  Wife states  the patient was having difficulties with frequent exacerbation's and was  recommended to change over to Pulmicort and Brovana nebulizer.  He was  decreased in his Atenolol to one-half tablet daily and added in Protonix  for potential reflux that could be irritating the airways.  The patient  returns back today reporting that he has substantially improved with  decreased cough, shortness of breath and increased activity tolerance.  The patient has brought all of his medications in today for review.  The  patient denies any chest pain, increased shortness of breath, orthopnea,  PND or increased leg swelling.   PAST MEDICAL HISTORY:  Past medical history is reviewed.  Current  medications are reviewed.   PHYSICAL EXAMINATION:  GENERAL APPEARANCE:  The patient is a pleasant  elderly male in no acute distress.  VITAL SIGNS:  He is afebrile with stable vital signs.  Oxygen saturation  is 95% on room air.  HEENT:  Unremarkable.  NECK:  Supple without cervical adenopathy.  No jugular venous  distention.  LUNG:  Sounds are diminished in the bases, otherwise clear.  CARDIAC:  Regular rate and rhythm.  ABDOMEN:  Soft and nontender.  EXTREMITIES:  Warm without any edema.   IMPRESSION:  1. Chronic obstructive pulmonary disease with recent exacerbation's,      now much improved with his new regimen of Brovana and Pulmicort      along with Spiriva.  The patient will continue on his current      regimen and follow back up with Dr. Sherene [name] in six weeks or  sooner if      needed.  2. Complex medication regimen.  The patient's medications were      reviewed in detail.  Patient education is provided.  Patient has a      computerized medication calendar and was adjusted accordingly.  3. Hypertension.  The patient's blood pressure continues to be well      controlled on decreased atenolol dose.  4. Suspected gastroesophageal reflux disease.  The patient is      currently well compensated on      Protonix daily.  The patient may also use Prilosec if Protonix is      not covered by his insurance.      Rubye Oaks, NP  Electronically Signed      Charlaine Dalton. Arthur Sires, MD, [facility]  Electronically Signed   TP/MedQ  DD: 07/30/2006  DT: 07/30/2006  Job #: [id number]

## 2010-09-07 NOTE — Assessment & Plan Note (Signed)
[facility]                             PULMONARY OFFICE NOTE   NAME:[name], [name]                      MRN:          [id number]  DATE:06/10/2006                            DOB:          08-05-1926    HISTORY:  A 77 year old white male with chronic obstructive pulmonary  disease with a FEV1 of a 42% predicted with a ratio of 38%.  No  improvement after bronchodilators and had improved on Spiriva capsules  as monotherapy, but since the 14th has become much worse with increasing  dyspnea with activity such as trying to walk more than 30 yards,  increasing cough productive of green sputum and subjective wheezing.  He  came in today as a work in with saturation only 89% on room air and had  not yet actually used the nebulizer solution that he has at home, we use  on a p.r.n. basis.  He also is not using Mucinex beyond the 1 or 2 a day  dose that he usually uses.  Note that all of his medicines are written  in medication calendar format, which expressively states the maximum  dose of the p.r.n.'s listed separately under specific symptoms and he  has not used this to his full benefit (See below).   PHYSICAL EXAMINATION:  He is a slow moving quite pleasant ambulatory  white male that does not appear toxic.  He is afebrile with normal vital signs (he reported fever on February  14, but none since) and denies any rigors.  HEENT:  Unremarkable. Nares clear.  LUNGS:  Revealed chunky rhonchi, inspiratory and expiratory, but nothing  localized and no bronchial change.  HEART:  Regular rhythm without murmur, gallop or rub.  ABDOMEN:  Soft, benign, positive hoover's sign.  EXTREMITIES:  Warm without calf tenderness, cyanosis or clubbing.   He received a nebulizer therapy in the office which improved him 50%  in terms of dyspnea and congestion.  A chest x-ray was obtained and his  chronic obstructive pulmonary disease was severe with no definite   infiltrates.   IMPRESSION:  Acute paratracheal bronchitis associated with fever in this  patient with severe chronic obstructive pulmonary disease.  I  recommended a 7 day course on factive, a 6 day course of Prednisone and  also review with him each and every one of his maintenance p.r.n. so by  the end of the visit I was more convinced that he could actually the  instruction exactly the way they were written.  Mainly he can use his  Albuterol up to every 4 hours and mucinex up to 2 every 12 for specific  symptoms listed on the medication calendar.   I have given him 2 days of Factive samples.  If he is not better at the  end of 2 days, I have asked him to return to the office, otherwise he  needs to fill a prescription for 5 more days after that.     Charlaine Dalton. [name], MD, [facility]  Electronically Signed    MBW/MedQ  DD: 06/10/2006  DT: 06/11/2006  Job #: [id number]  cc:   Tinnie Gens A. Tawanna Cooler, MD

## 2010-09-07 NOTE — Assessment & Plan Note (Signed)
[facility]                             PULMONARY OFFICE NOTE   NAME:[name], [name]                      MRN:          [id number]  DATE:07/07/2006                            DOB:          [birth year]    HISTORY OF PRESENT ILLNESS:  The patient is a 77 year old white male  patient of Dr. Thurston Hole who has a known history of severe COPD who returns  to the office and presents with cough, congestion and shortness of  breath.  The patient has had several recent exacerbations over the last  month, initially being seen by Dr. [name] on June 10, 2006 for an  acute tracheobronchitis.  The patient was started on a 6-day course of  Fastin and a prednisone taper.  This has been improved; however, on  June 28, 2006, the patient developed the sudden onset of shortness of  breath and was seen in the emergency room at [facility].  He was given a  prednisone taper and a Z-Pak, along with some nebulized bronchodilators.  The patient underwent a chest x-ray which was reported to be without any  acute changes.  The patient reports that his symptoms improved  substantially.  In fact, he was able to go out and play a couple rounds  of golf.  However, on July 05, 2006, he woke up in the early morning  hours with significant shortness of breath and had to be transported to  the emergency room by EMS services.  The patient's O2 saturation  initially on arrival was 79%.  The patient received oxygen therapy along  with IV steroids.  The patient improved nicely, and O2 saturations  maintained 90-93% on room air.  The patient was discharged and informed  to follow back up with Dr. [name] today.  Over the last 2 days, the  patient state he is improved; however, he continues to have some dry  cough and intermittent wheezing.  He denies any hemoptysis, chest pain,  orthopnea, PND or leg swelling.   PAST MEDICAL HISTORY:  Reviewed.   CURRENT MEDICATIONS:  Reviewed.   PHYSICAL  EXAMINATION:  GENERAL:  The patient is an elderly male in no  acute distress.  VITAL SIGNS:  He is afebrile with stable vitals.  Oxygen saturation is  92% on room air.  HEENT:  Nasal mucosa with some mild erythema.  Nontender sinuses.  Posterior pharynx is clear.  NECK:  Supple without cervical adenopathy.  No JVD.  LUNGS:  Lung sounds reveal coarse breath sounds bilaterally with a few  expiratory wheezes.  CARDIAC:  Regular rate.  ABDOMEN:  Soft and nontender.  EXTREMITIES:  Warm without any edema.   IMPRESSION AND PLAN:  Slow-to-resolve chronic obstructive pulmonary  disease and asthmatic bronchitic exacerbation.  The patient will begin a  prednisone taper over the next week.  He will add in Mucinex DM twice  daily.  Will also add in Protonix 40 mg daily for any residual reflux  that could be irritating the airways.  The patient will also be started  on oxygen at  bedtime and with activities,  and the patient will return here in 1 week  with Dr. [name], or sooner if needed.      Rubye Oaks, NP  Electronically Signed      Charlaine Dalton. [name], MD, [facility]  Electronically Signed   TP/MedQ  DD: 07/07/2006  DT: 07/08/2006  Job #: [id number]

## 2010-09-07 NOTE — Assessment & Plan Note (Signed)
[facility]                               PULMONARY OFFICE NOTE   NAME:ENOCHSIndalecio, Malmstrom                      MRN:          [id number]  DATE:12/06/2005                            DOB:          May 26, 1926    This is a pulmonary/extended followup office visit.   HISTORY:  A 77 year old white male returns for PFTs as requested, with  complaints of dyspnea doing anything more than slow ADLs with no chest pain,  fevers, chills, sweats, orthopnea, PND or leg swelling, or variability in  terms of his dyspnea.  His main complaint, in fact, is one of hoarseness and  tremors that he attributes to the use of Advair.  The record indicates that  he previously was not impressed that Spiriva helped him, and stopped it.  He  does not recall any adverse effects from the medication.  He has dyspnea  with anything more than 5 minutes of walking flat.  He does sleep well at  night, with no orthopnea, PND, leg swelling, nocturnal cough or wheeze.   MEDICATIONS:  Reviewed in detail using the medication calendar format, dated  December 06, 2005.  NOTE:  The patient is still on Mucinex DM b.i.d., although  denies any cough at all now.   PHYSICAL EXAMINATION:  GENERAL:  He is a chronically ill, ambulatory white  male in no acute distress.  He has obvious tremor right greater than left  arm.  He also is profoundly hoarse.  HEENT:  Unremarkable, oropharynx is clear.  LUNGS:  Lung fields reveal diminished breath sounds, but no true wheezes.  CARDIAC:  There is a regular rhythm without murmur, gallop or rub.  ABDOMEN:  Soft, benign.  EXTREMITIES:  Warm without calf tenderness, cyanosis, clubbing or edema.   LABORATORY DATA:  PFTs were reviewed, and reveal no reversibility after  bronchodilators, with severe airflow obstruction.   IMPRESSION:  1. Chronic obstructive pulmonary disease with no significant      reversibility.  2. Possible adverse drug affect from Advair.  I  thought it would be      reasonable, therefore, to rechallenge him with Spiriva and ask him to      stop the Advair at this point, to see if he does not do just as well.      He does have albuterol to use p.r.n.   I took this opportunity to review with him optimal DPI and MDI technique,  and then review each and every one of his medicines from his maintenance  versus p.r.n. sheet, using the medication calendar format.  I spent an extra  15-20 minutes of a 25-minute visit discussing these issues in detail.  In  terms of the tremor, if it does not resolve off of that beta 2 agonist, I  could foresee the possibility of switching from atenolol to propranolol, but  I would do so using very low doses.  Let us hope that the problem resolves  on Spiriva, and that nonspecific beta blockers are not  necessary.  Followup will be in 4 weeks for a full medication  reconciliation, having made multiple changes to his medication calendar  today.                                   Charlaine Dalton. [name], MD, FCCP   MBW/MedQ  DD:  12/06/2005  DT:  12/07/2005  Job #:  [id number]   cc:   Tinnie Gens A. Tawanna Cooler, MD

## 2010-09-07 NOTE — Assessment & Plan Note (Signed)
[facility]                             PULMONARY OFFICE NOTE   NAME:[name], [name]                      MRN:          [id number]  DATE:07/14/2006                            DOB:          Nov 28, 1926    HISTORY:  A 77 year old white male who carries a diagnosis of COPD and  states that every time he gets taken off prednisone he flares. This  pattern has been true for the last several months with several courses  of prednisone and then within 3 to 5 days of stopping it he finds  himself back in the emergency room. He was just in the emergency room  on March 15 and is back to baseline at present. On a typical  good  day he does not feel that he needs oxygen at all but he does continue  to struggle making it from the mailbox to the house before having to  stop on the landing and catch his breath. This is about as good as he  gets for at least several months now. He denies any exertional chest  pain, significant in variability in terms of his best day function  related to weather and environmental change, associated chest pain,  fevers, chills, sweats, orthopnea, PND, leg swelling, or significant  sputum production.   For full inventory of medications please see face sheet dated July 14, 2006.   On physical examination, he is a stoic ambulatory white male in no acute  distress. He is afebrile, stable vital signs.  HEENT: Unremarkable. Pharynx clear.  NECK: Supple without cervical adenopathy, or tenderness. Trachea is  midline.  LUNGS: Lung fields reveal diminished breath sounds bilaterally. No  wheezing.  There is a regular rate and rhythm without murmur, gallop, or rub.  ABDOMEN: Soft, benign.  EXTREMITIES: Warm without calf tenderness, cyanosis, clubbing, or edema.   IMPRESSION:  Chronic obstructive pulmonary disease with a definite  asthmatic component. I initially tried to teach him MDI technique  without any success and therefore I plan to  switch him over to  formoterol/budesonide b.i.d., continue to use albuterol on a p.r.n.  basis. This should help eliminate the marked reduction in function that  occurs every time he runs out of prednisone, unless of course the  problem is more of a psychogenic then a actual airway related issue.   I spent almost 30 minutes with this patient and his wife and daughter  today going over the plan in writing and still I am not sure they got  the message about the difference between a maintenance and a p.r.n.  medications, and even after reviewing these issues line by line, had  trouble processing explicit instructions outlined in front of them on  the calendar I provided them.  Rather than just tell them the answer, I  worked it through with them using the instructions that provide all the  answers in a very userfriendly and unambiguous fashion.   I also note for the record that he is on a relatively high dose of beta  blocker, all be it somewhat specific in atenolol,  his pulse typically  here is between 50 and 60 and therefore with a relatively  low blood pressure I am going to take this opportunity therefore to  reduce the dose by half.  Followup will be in 2 weeks for purpose of  medication reconciliation, with our nurse practitioner.     Charlaine Dalton. [name], MD, [facility]  Electronically Signed    MBW/MedQ  DD: 07/14/2006  DT: 07/14/2006  Job #: [id number]   cc:   Tinnie Gens A. Tawanna Cooler, MD

## 2010-09-07 NOTE — Assessment & Plan Note (Signed)
[facility]                               PULMONARY OFFICE NOTE   NAME:ENOCHSNachman, Sundt                      MRN:          [id number]  DATE:10/29/2005                            DOB:          [birth year]    CHIEF COMPLAINT:  Increased cough, congestion, shortness of breath.   HISTORY OF PRESENT ILLNESS:  Mr. Pinsky presents today with approximately a  2 week history of worsening cough, which is now productive of green sputum.  He reports he had recently vacationed at the beach prior to development of  his worsening cough.  He notes he has also had a significant increase in  wheeze, particularly with activity, as well as increased chest congestion  and associated exertional dyspnea.  His wife tells me that they typically  take a daily walk for exercise, and over the last 2 to 3 days Mr. Mchaffie has  had to stop several times during this walk to catch his breath.  Additionally, he had an episode where he had coughed so hard he felt as  though as he had almost lost his balance and had to catch himself from  falling in the bathroom approximately 2 days ago.  He denies fever, chills,  chest pain and leg swelling.  He also denies significant postnasal drip or  gastroesophageal reflux symptoms.   PAST MEDICAL HISTORY:  Significant for chronic obstructive pulmonary disease  with a minimal asthmatic bronchitic component.   MEDICATIONS:  His maintenance medication regimen consists of:  1.  Potassium chloride 20 mEq b.i.d.  2.  Atenolol with chlorthalidone 50/25 mg daily.  3.  Pravastatin 20 mEq q.h.s.  4.  Advair 250/50 b.i.d.  5.  Aspirin 81 mg q.d.  6.  Centrum Silver q.d.  7.  Mucinex DM b.i.d.   PHYSICAL EXAMINATION:  VITAL SIGNS:  Temperature 97 degrees, weight 183,  blood pressure 108/68, pulse rate 64, saturation 93% on room air.  HEENT:  The left ear canal is blocked with wax.  The right ear canal is  patent, with good light reflex.  He has no  JVD or painful adenopathy.  The  posterior pharynx is slightly erythemic but not cobblestoned, and really  without any significant exudate.  PULMONARY:  He has a significantly prolonged expiratory wheeze with an  occasional scattered rhonchi.  I note no rales upon exam, and he has no  accessory muscle use at this time.  CARDIAC:  Regular rate and rhythm.  EXTREMITIES:  Right upper extremity tremor noted.  No edema.  ABDOMEN:  Soft, non-tender.  NEUROLOGIC:  Grossly intact.   IMPRESSION:  Acute bronchitis flare, in setting of chronic obstructive  pulmonary disease.   PLAN:  1.  A pulsed prednisone taper 60 mg for 2, then 40 mg for 2, then 20 mg for      2, then 10 mg for 2, then discontinuing.  2.  Azithromycin 500 mg daily x3 days.  3.  Finally, Mr. Elahi has a followup for routine PFTs on August 16.  His      wife asked if should this  be sooner, and I suggested that August 16      would be a perfect time for followup for routine PFTs, should his      symptoms improve.  However, he was instructed should his symptoms not      improve over the next 5 to 7 days, that he should come back for further      reevaluation.                                   Zenia Resides, NP   PB/MedQ  DD:  10/29/2005  DT:  10/30/2005  Job #:  [id number]

## 2010-09-07 NOTE — Assessment & Plan Note (Signed)
[facility]                                 ON-CALL NOTE   NAME:ENOCHSClifton, [name]                      MRN:          [id number]  DATE:06/28/2006                            DOB:          01-08-1927    PRIMARY CARE PHYSICIAN:  Casimiro Needle B. [name], M.D.   TELEPHONE PROGRESS NOTE:  Mrs. Schwabe called about her husband, [name], a patient of Dr. [name] with COPD. He has had more shortness of  breath today and she gave him a breathing treatment this morning. This  seemed to give him a little bit of relief but when he had more shortness  of breath this afternoon, a second treatment did not seem to help much.  She mentioned that he had a mild cough but could not give me a lot of  additional information. She wondered if she should give him another  treatment at the time of this call.   I instructed the wife to take the patient to the emergency room for  assessment, to find out the etiology of his dyspnea. It did not seem  reasonable to continue giving him treatments, not knowing what acute  factors may be involved. She was instructed to take him to the emergency  room or call 911 for transportation, if she felt this was necessary. She  understood these instructions.     Lonzo Cloud. Kriste Basque, MD  Electronically Signed    SMN/MedQ  DD: 06/28/2006  DT: 06/28/2006  Job #: [id number]   cc:   Charlaine Dalton. [name], MD, FCCP

## 2010-09-07 NOTE — Assessment & Plan Note (Signed)
[facility]                               PULMONARY OFFICE NOTE   NAME:[name], [name]                      MRN:          [id number]  DATE:01/03/2006                            DOB:          1926-05-13    HISTORY OF PRESENT ILLNESS:  The patient is a 77 year old white male patient  of Dr. [name] who has a known history of COPD and presents for a 60-month  followup.  At last visit, the patient had been complaining of hoarseness and  intermittent tremors which was secondary to be secondary to Advair.  This  was discontinued.  The patient was changed over to Spiriva.  The patient  reports he has been tolerating Spiriva well.  He has noted slight  improvement in his hoarseness; however, he continues to have hand tremors.  The patient does have an appointment with the neurologist concerning these  tremors, as well.  The patient has brought all of his medications in today  for a review.  The patient denies any chest pain, increased shortness of  breath or leg swelling.   PHYSICAL EXAMINATION:  GENERAL:  The patient is a pleasant male in no acute  distress.  VITAL SIGNS:  He is afebrile with stable vital signs.  O2 saturation is 93%  on room air.  HEENT:  Unremarkable.  NECK:  Supple without adenopathy.  LUNGS:  Lung sounds revealed diminished breath sounds without any wheezing  or crackles.  CARDIAC:  Regular rate and rhythm.  ABDOMEN:  Soft without any palpable organomegaly.  EXTREMITIES:  Warm without any calf tenderness, cyanosis, clubbing or edema.  The patient does have a slight resting tremor of the upper extremities,  right greater than left.   IMPRESSION AND PLAN:  1. Chronic obstructive pulmonary disease.  Currently tolerating Spiriva      without difficulty.  Will continue on current regimen and follow back      up with Dr. [name] in 2 months or sooner if needed.  2. Hoarseness.  Will need to continue to monitor this.  The patient is a  former smoker.  If this does not resolve, will need to be referred to      possible ENT.  3. Complex medication regimen.  The patient's medications were reviewed in      detail.  Patient education was provided.  The patient's computerized      medication calendar was adjusted accordingly and reviewed in detail.      The patient is aware to bring this back to back to each and every      visit.                                   Rubye Oaks, NP                                Charlcie Cradle Delford Field, MD, FCCP   TP/MedQ  DD:  01/03/2006  DT:  01/04/2006  Job #:  [id number]

## 2010-10-29 NOTE — Assessment & Plan Note (Signed)
[facility]                                ON-CALL NOTE  NAME:[name], [name]                        MRN:          [id number] DATE:10/28/2010                            DOB:          Jul 20, 1926   PRIMARY CARE PROVIDER:  Dr. Sandrea Hughs.  Mr. Tuzzolino' wife called the on-call number on the afternoon of October 27, 2010, requesting a bridge prescription of Brovana and Pulmicort and that his scripts from [facility] had not yet arrived.  The [facility] was notified and 1 box of Brovana and Pulmicort was given with a prescription as in the last note Brovana 15 mcg/2 mL nebulized b.i.d. and Pulmicort 0.5 mg/2 mL nebulized b.i.d.    Rogelia Rohrer, MD Electronically Signed   Clemetine Marker  DD: 10/28/2010  DT: 10/29/2010  Job #: [id number]

## 2010-11-21 ENCOUNTER — Encounter: Payer: Self-pay | Admitting: Internal Medicine

## 2010-11-22 ENCOUNTER — Ambulatory Visit (INDEPENDENT_AMBULATORY_CARE_PROVIDER_SITE_OTHER)
Admission: RE | Admit: 2010-11-22 | Discharge: 2010-11-22 | Disposition: A | Discharge status: Home / Self Care | Payer: Medicare Other | Source: Ambulatory Visit | Attending: Internal Medicine | Admitting: Internal Medicine

## 2010-11-22 ENCOUNTER — Encounter: Payer: Self-pay | Admitting: Internal Medicine

## 2010-11-22 ENCOUNTER — Other Ambulatory Visit (INDEPENDENT_AMBULATORY_CARE_PROVIDER_SITE_OTHER): Payer: Medicare Other

## 2010-11-22 ENCOUNTER — Ambulatory Visit (INDEPENDENT_AMBULATORY_CARE_PROVIDER_SITE_OTHER): Payer: Medicare Other | Admitting: Internal Medicine

## 2010-11-22 VITALS — BP 112/60 | HR 55 | Temp 97.8°F | Ht 69.0 in | Wt 197.0 lb

## 2010-11-22 DIAGNOSIS — R0600 Dyspnea, unspecified: Secondary | ICD-10-CM

## 2010-11-22 DIAGNOSIS — J449 Chronic obstructive pulmonary disease, unspecified: Secondary | ICD-10-CM

## 2010-11-22 DIAGNOSIS — R0989 Other specified symptoms and signs involving the circulatory and respiratory systems: Secondary | ICD-10-CM

## 2010-11-22 DIAGNOSIS — R0609 Other forms of dyspnea: Secondary | ICD-10-CM

## 2010-11-22 DIAGNOSIS — J9610 Chronic respiratory failure, unspecified whether with hypoxia or hypercapnia: Secondary | ICD-10-CM

## 2010-11-22 DIAGNOSIS — J4489 Other specified chronic obstructive pulmonary disease: Secondary | ICD-10-CM

## 2010-11-22 DIAGNOSIS — R609 Edema, unspecified: Secondary | ICD-10-CM

## 2010-11-22 DIAGNOSIS — R0602 Shortness of breath: Secondary | ICD-10-CM

## 2010-11-22 LAB — HEPATIC FUNCTION PANEL
AST: 16 U/L (ref 0–37)
Albumin: 4.3 g/dL (ref 3.5–5.2)
Alkaline Phosphatase: 66 U/L (ref 39–117)
Total Protein: 6.9 g/dL (ref 6.0–8.3)

## 2010-11-22 LAB — BRAIN NATRIURETIC PEPTIDE: Pro B Natriuretic peptide (BNP): 23 pg/mL (ref 0.0–100.0)

## 2010-11-22 LAB — BASIC METABOLIC PANEL
Chloride: 99 mEq/L (ref 96–112)
GFR: 68.9 mL/min (ref >60.00)
Glucose, Bld: 71 mg/dL (ref 70–99)
Potassium: 4.1 mEq/L (ref 3.5–5.1)
Sodium: 140 mEq/L (ref 135–145)

## 2010-11-22 LAB — CBC WITH DIFFERENTIAL/PLATELET
Basophils Relative: 0.3 % (ref 0.0–3.0)
Hemoglobin: 16.6 g/dL (ref 13.0–17.0)
Lymphocytes Relative: 14 % (ref 12.0–46.0)
MCHC: 33.2 g/dL (ref 30.0–36.0)
Monocytes Relative: 7 % (ref 3.0–12.0)
Neutro Abs: 6.8 10*3/uL (ref 1.4–7.7)
RBC: 5.06 Mil/uL (ref 4.22–5.81)

## 2010-11-22 NOTE — Assessment & Plan Note (Signed)
Newly desaturating but nothing else to offer here x 02 and rehab.    Each maintenance medication was reviewed in detail including most importantly the difference between maintenance and as needed and under what circumstances the prns are to be used.  Please see instructions for details which were reviewed in writing and the patient given a copy.

## 2010-11-22 NOTE — Progress Notes (Signed)
Subjective:     Patient ID: [name], male   DOB: [birth year], 77 y.o.   MRN: [id number]  HPI  12 yowm quit smoking 1980  COPD with an FEV1 42% predicted with a ratio of 38% December 05, 2005,  maintained on Brovana and Pulmicort combination with Spiriva. He  says that he can do pretty much anything he wants to so long as he paces  himself oxygen dependent at bedtime since 2008.  baseline = sob from mb back to house without stopping but once gets to house sit down because it's uphill but can go 10-15 min flat without stopping.   October 21, 2008 ov no change in activity tolerance and sleeping ok on 02 o/w doesn't wear it. rec xopenex but no change in ex tolerance and recovery time so didn't continue it prn   June 07, 2009 Followup. Pt needs to re-qualify for noct o2   Aug 31, 2009 3 month follow up with PFTs. Pt states breathing is doing "ok" unless he is walking up a hill. you have moderate COPD is early Stage III (Stage IV is the worst) but you're better than you were in 2007  Therefore work harder to maintain conditioning and loose some weight.  Weight control is key no change in rx with spiriva/ brov and bud.   March 05, 2010 cc doe x walking up to 30 min total around the neighborhood with freqent stops. > no change rx  11/22/2010 ov/Atonya Templer cc not able to play golf due to sob. No cough. slt increase in leg swelling all worse x 6 months, indolent onset, minimally progressive.   Pt denies any significant sore throat, dysphagia, itching, sneezing,  nasal congestion or excess/ purulent secretions,  fever, chills, sweats, unintended wt loss, pleuritic or exertional cp, hempoptysis, orthopnea pnd or leg swelling.    Also denies any obvious fluctuation of symptoms with weather or environmental changes or other aggravating or alleviating factors.         Family History:  Family History of Cardiovascular disorder  neg resp dz/atopy   Social History:  Former Smoker, quit 1980    Alcohol use-yes  Drug use-no  Retired    Past Medical History:  GERD (ICD-530.81)  HYPERCHOLESTEROLEMIA (ICD-272.0)  PARKINSON'S DISEASE (ICD-332.0)  HYPERLIPIDEMIA, MIXED (ICD-272.2)  HYPERTENSION (ICD-401.9)  HYPERLIPIDEMIA (ICD-272.4)  HEALTH SCREENING (ICD-V70.0)  BENIGN PROSTATIC HYPERTROPHY (ICD-600.00)  Obesity 5/ 8"  - Target wt = 190 for BMI < 30 , 171 for BMI 26  COPD (ICD-496)  - PFT's 11/2005 FEV1 42% with ratio 38% with no improvement after bdilators with DLCO 73%  - PFT's 08/31/09 FEV1 47% with ratio 32% with no better after B2 and DLC0 70  - 09/05/07 overnight pulse ox on ra showed 1:46 min with sat <88  - HFA 25> 50 % October 21, 2008 so changed to neb brov/budesonide  - DPI 100% p coaching June 07, 2010  - Repeat overnight sat ordered June 07, 2009 >> adequate sats on 2lpm (5 min < 89)  - Walking sats RA = ok x 2 laps March 05, 2010> desats p 2 laps 11/22/2010 > resolved on 2lpm .  Review of Systems     Objective:   Physical Exam Obese white male with mild resting tremor of the right arm and hand with obvious moderate potbelly contour abdomen and mild hoarseness  wt 202 October 21, 2008 > 206 June 07, 2009 > 206 Aug 31, 2009 > 198 March 05, 2010 >  202 June 07, 2010 > 197 11/22/2010  HEENT mild turbinate edema. Oropharynx no thrush or excess pnd or cobblestoning. No JVD or cervical adenopathy. Mild accessory muscle hypertrophy. Trachea midline, nl thryroid. Chest was hyperinflated by percussion with diminished breath sounds and marked increased exp time without wheeze. Hoover sign positive onset of inspiration. Regular rate and rhythm without murmur gallop or rub or increase P2.  Trace edema bilaterally.  Decrease s1s2 Abd: no hsm, nl excursion. Ext warm without cyanosis or clubbing     cxr 11/22/2010 Emphysema/COPD. No acute findings.   Assessment:         Plan:

## 2010-11-22 NOTE — Patient Instructions (Addendum)
Please see patient coordinator before you leave today  to schedule pulmonary rehabilitation and ambulatory 02 at 2lpm  Weight control is simply a matter of calorie balance which needs to be tilted in your favor by eating less and exercising more.  To get the most out of exercise, you need to be continuously aware that you are short of breath, but never out of breath, for 30 minutes daily. As you improve, it will actually be easier for you to do the same amount of exercise  in  30 minutes so always push to the level where you are short of breath.    Be very careful with salt intake - we will call you with lab results done today to evaluate the leg swelling  Please schedule a follow up visit in 6  Weeks  but call sooner if needed

## 2010-11-22 NOTE — Assessment & Plan Note (Signed)
?   Incipient cor pulmonale with new albeit mild peripheral edema - needs to be started on 2lpm with activity, ok at rest

## 2010-11-23 NOTE — Progress Notes (Signed)
Quick Note:  Spoke with pt and notified of results per Dr. Wert. Pt verbalized understanding and denied any questions.  ______ 

## 2011-01-03 ENCOUNTER — Encounter: Payer: Self-pay | Admitting: Internal Medicine

## 2011-01-03 ENCOUNTER — Ambulatory Visit (INDEPENDENT_AMBULATORY_CARE_PROVIDER_SITE_OTHER): Payer: Medicare Other | Admitting: Internal Medicine

## 2011-01-03 DIAGNOSIS — J4489 Other specified chronic obstructive pulmonary disease: Secondary | ICD-10-CM

## 2011-01-03 DIAGNOSIS — I10 Essential (primary) hypertension: Secondary | ICD-10-CM

## 2011-01-03 DIAGNOSIS — J449 Chronic obstructive pulmonary disease, unspecified: Secondary | ICD-10-CM

## 2011-01-03 DIAGNOSIS — J9610 Chronic respiratory failure, unspecified whether with hypoxia or hypercapnia: Secondary | ICD-10-CM

## 2011-01-03 NOTE — Assessment & Plan Note (Signed)
Discussed with pt/ wife that 02 for breathing is like glasses for viz problems, won't work if not using it   See instructions for specific recommendations which were reviewed directly with the patient who was given a copy with highlighter outlining the key components.

## 2011-01-03 NOTE — Patient Instructions (Addendum)
Take bystolic 5 mg one daily in place of tenoretic on a trial basis to see if it helps your breathing or cough  Wear the 02 with any activity outside your house and at bedtime automatically whether you think you need it or not  See Tammy NP w/in 2 weeks with all your medications, even over the counter meds, separated in two separate bags, the ones you take no matter what vs the ones you stop once you feel better and take only as needed when you feel you need them.   Tammy  will generate for you a new user friendly medication calendar that will put [place] all on the same page re: your medication use.     Without this process, it simply isn't possible to assure that we are providing  your outpatient care  with  the attention to detail we feel you deserve.   If we cannot assure that you're getting that kind of care,  then we cannot manage your problem effectively from this clinic.  Once you have seen Tammy and we are sure that we're all on the same page with your medication use she will arrange follow up with me.

## 2011-01-03 NOTE — Assessment & Plan Note (Signed)
  When respiratory symptoms progress  after a patient reports complete smoking cessation,  it is very hard to "blame" COPD specifically or airways disorders in general  ie it doesn't make any more sense than hearing a  [employer]SC[employer] driver wrecked his car while driving his kids to school or a surgeon sliced his hand off carving roast beef (it must be rare indeed!)     That is to say, once the high risk activity stops,  the symptoms should not suddenly erupt or markedly worsen.  If so, the differential diagnosis should include  obesity/deconditioning,  LPR/Reflux/Aspiration syndromes,  occult CHF, or  especially side effect of medications commonly used in this population.    His bnp and HC03 were nl at last ov with emphysema only on cxr so suspect poor adherence to meds./ 02/ deconditioning are all at play here -   note TSH trending up and needs to be checked next ov.  ? Beta blocker issues > try change to bystolic  ? Adherence:   To keep things simple, I have asked the patient to first separate medicines that are perceived as maintenance, that is to be taken daily "no matter what", from those medicines that are taken on only on an as-needed basis and I have given the patient examples of both, and then return to see our NP to generate a  detailed  medication calendar which should be followed until the next physician sees the patient and updates it.

## 2011-01-03 NOTE — Progress Notes (Signed)
Subjective:     Patient ID: [name], male   DOB: Nov 28, 1926, 77 y.o.   MRN: [id number]  HPI  34 yowm quit smoking 1980  COPD with an FEV1 42% predicted with a ratio of 38% December 05, 2005,  maintained on Brovana and Pulmicort combination with Spiriva. He  says that he can do pretty much anything he wants to so long as he paces  himself oxygen dependent at bedtime since 2008.  baseline = sob from mb back to house without stopping but once gets to house sit down because it's uphill but can go 10-15 min flat without stopping.   October 21, 2008 ov no change in activity tolerance and sleeping ok on 02 o/w doesn't wear it. rec xopenex but no change in ex tolerance and recovery time so didn't continue it prn    Aug 31, 2009 3 month follow up with PFTs. Pt states breathing is doing "ok" unless he is walking up a hill.  rec you have moderate COPD is early Stage III (Stage IV is the worst) but you're better than you were in 2007  Therefore work harder to maintain conditioning and loose some weight.  Weight control is key  And no change in rx with spiriva/ brov and bud needed.  March 05, 2010 cc doe x walking up to 30 min total around the neighborhood with freqent stops. > no change rx  11/22/2010 ov/Aemilia Dedrick cc not able to play golf due to sob. No cough. slt increase in leg swelling all worse x 6 months, indolent onset, minimally progressive. rec Please see patient coordinator before you leave today  to schedule pulmonary rehabilitation and ambulatory 02 at 2lpm  Weight control is simply a matter of calorie balance which needs to be tilted in your favor by eating less and exercising more.  To get the most out of exercise, you need to be continuously aware that you are short of breath, but never out of breath, for 30 minutes daily. As you improve, it will actually be easier for you to do the same amount of exercise  in  30 minutes so always push to the level where you are short of breath.    Be  very careful with salt intake - we will call you with lab results done today to evaluate the leg swelling   01/03/2011 f/u ov/Verginia Toohey no rehab yet,  Wearing 02 at hs only. Minimal assoc cough/ sense of congestion. Confused with timing of multiple meds Sleeping ok without nocturnal  or early am exac of resp c/o's or need for noct saba.    Pt denies any significant sore throat, dysphagia, itching, sneezing,  nasal congestion or excess/ purulent secretions,  fever, chills, sweats, unintended wt loss, pleuritic or exertional cp, hempoptysis, orthopnea pnd or leg swelling.    Also denies any obvious fluctuation of symptoms with weather or environmental changes or other aggravating or alleviating factors.         Family History:  Family History of Cardiovascular disorder  neg resp dz/atopy   Social History:  Former Smoker, quit 1980  Alcohol use-yes  Drug use-no  Retired    Past Medical History:  GERD (ICD-530.81)  HYPERCHOLESTEROLEMIA (ICD-272.0)  PARKINSON'S DISEASE (ICD-332.0)  HYPERLIPIDEMIA, MIXED (ICD-272.2)  HYPERTENSION (ICD-401.9)  HYPERLIPIDEMIA (ICD-272.4)  HEALTH SCREENING (ICD-V70.0)  BENIGN PROSTATIC HYPERTROPHY (ICD-600.00)  Obesity 5/ 8"  - Target wt = 190 for BMI < 30 , 171 for BMI 26  COPD (ICD-496)  - PFT's 11/2005  FEV1 42% with ratio 38% with no improvement after bdilators with DLCO 73%  - PFT's 08/31/09 FEV1 47% with ratio 32% with no better after B2 and DLC0 70  - 09/05/07 overnight pulse ox on ra showed 1:46 min with sat <88  - HFA 25> 50 % October 21, 2008 so changed to neb brov/budesonide  - DPI 100% p coaching June 07, 2010  - Repeat overnight sat ordered June 07, 2009 >> adequate sats on 2lpm (5 min < 89)  - Walking sats RA = ok x 2 laps March 05, 2010> desats p 2 laps 11/22/2010 > resolved on 2lpm .       Objective:   Physical Exam Obese white male with mild resting tremor of the right arm and hand with obvious moderate potbelly contour abdomen  and mild hoarseness  wt 202 October 21, 2008 > 206 June 07, 2009 > 202 June 07, 2010 > 197 11/22/2010 > wt  198 HEENT mild turbinate edema. Oropharynx no thrush or excess pnd or cobblestoning. No JVD or cervical adenopathy. Mild accessory muscle hypertrophy. Trachea midline, nl thryroid. Chest was hyperinflated by percussion with diminished breath sounds and marked increased exp time without wheeze. Hoover sign positive onset of inspiration. Regular rate and rhythm without murmur gallop or rub or increase P2.  Trace edema bilaterally.  Decrease s1s2 Abd: no hsm, nl excursion. Ext warm without cyanosis or clubbing     cxr 11/22/2010 Emphysema/COPD. No acute findings.   Assessment:         Plan:

## 2011-01-17 ENCOUNTER — Ambulatory Visit (INDEPENDENT_AMBULATORY_CARE_PROVIDER_SITE_OTHER): Payer: Medicare Other | Admitting: Adult Health

## 2011-01-17 ENCOUNTER — Encounter: Payer: Self-pay | Admitting: Adult Health

## 2011-01-17 DIAGNOSIS — J449 Chronic obstructive pulmonary disease, unspecified: Secondary | ICD-10-CM

## 2011-01-17 DIAGNOSIS — I10 Essential (primary) hypertension: Secondary | ICD-10-CM

## 2011-01-17 MED ORDER — ALBUTEROL SULFATE HFA 108 (90 BASE) MCG/ACT IN AERS: 2.0000 | INHALATION_SPRAY | RESPIRATORY_TRACT | Status: DC | PRN

## 2011-01-17 MED ORDER — NEBIVOLOL HCL 5 MG PO TABS: 5.0000 mg | ORAL_TABLET | Freq: Every day | ORAL | Status: DC

## 2011-01-17 NOTE — Progress Notes (Signed)
Subjective:     Patient ID: [name], male   DOB: [birth year], 77 y.o.   MRN: [id number]  HPI 83 yowm quit smoking 1980  COPD with an FEV1 42% predicted with a ratio of 38% December 05, 2005,  maintained on Brovana and Pulmicort combination with Spiriva. He  says that he can do pretty much anything he wants to so long as he paces  himself oxygen dependent at bedtime since 2008.  baseline = sob from mb back to house without stopping but once gets to house sit down because it's uphill but can go 10-15 min flat without stopping.   October 21, 2008 ov no change in activity tolerance and sleeping ok on 02 o/w doesn't wear it. rec xopenex but no change in ex tolerance and recovery time so didn't continue it prn    Aug 31, 2009 3 month follow up with PFTs. Pt states breathing is doing "ok" unless he is walking up a hill.  rec you have moderate COPD is early Stage III (Stage IV is the worst) but you're better than you were in 2007  Therefore work harder to maintain conditioning and loose some weight.  Weight control is key  And no change in rx with spiriva/ brov and bud needed.  March 05, 2010 cc doe x walking up to 30 min total around the neighborhood with freqent stops. > no change rx  11/22/2010 ov/Wert cc not able to play golf due to sob. No cough. slt increase in leg swelling all worse x 6 months, indolent onset, minimally progressive. rec Please see patient coordinator before you leave today  to schedule pulmonary rehabilitation and ambulatory 02 at 2lpm  Weight control is simply a matter of calorie balance which needs to be tilted in your favor by eating less and exercising more.  To get the most out of exercise, you need to be continuously aware that you are short of breath, but never out of breath, for 30 minutes daily. As you improve, it will actually be easier for you to do the same amount of exercise  in  30 minutes so always push to the level where you are short of breath.    Be  very careful with salt intake - we will call you with lab results done today to evaluate the leg swelling   01/03/2011 f/u ov/Wert no rehab yet,  Wearing 02 at hs only. Minimal assoc cough/ sense of congestion. Confused with timing of multiple meds Sleeping ok without nocturnal  or early am exac of resp c/o's or need for noct saba.  >>>changed tenoretic to bystolic   01/17/2011 Follow up and Med calendar  Returns for follow up and med review. We reviewed all his meds and updated his med calendar with pt education. It appears he is taking his meds correctly. Last visit tenoretic changed to bystolic. Is tolerating well with no known difficulty. No change in dyspnea level . No wheezing or cough.        Family History:  Family History of Cardiovascular disorder  neg resp dz/atopy   Social History:  Former Smoker, quit 1980  Alcohol use-yes  Drug use-no  Retired    Past Medical History:  GERD (ICD-530.81)  HYPERCHOLESTEROLEMIA (ICD-272.0)  PARKINSON'S DISEASE (ICD-332.0)  HYPERLIPIDEMIA, MIXED (ICD-272.2)  HYPERTENSION (ICD-401.9)  HYPERLIPIDEMIA (ICD-272.4)  HEALTH SCREENING (ICD-V70.0)  BENIGN PROSTATIC HYPERTROPHY (ICD-600.00)  Obesity 5/ 8"  - Target wt = 190 for BMI < 30 , 171 for BMI 26  COPD (ICD-496)  - PFT's 11/2005 FEV1 42% with ratio 38% with no improvement after bdilators with DLCO 73%  - PFT's 08/31/09 FEV1 47% with ratio 32% with no better after B2 and DLC0 70  - 09/05/07 overnight pulse ox on ra showed 1:46 min with sat <88  - HFA 25> 50 % October 21, 2008 so changed to neb brov/budesonide  - DPI 100% p coaching June 07, 2010  - Repeat overnight sat ordered June 07, 2009 >> adequate sats on 2lpm (5 min < 89)  - Walking sats RA = ok x 2 laps March 05, 2010> desats p 2 laps 11/22/2010 > resolved on 2lpm --Complex med regimen, med calendar updated 01/17/2011  .       Objective:   Physical Exam Obese white male with mild resting tremor of the right arm and hand  with obvious moderate potbelly contour abdomen and mild hoarseness  wt 202 October 21, 2008 > 206 June 07, 2009 > 202 June 07, 2010 > 197 11/22/2010 > wt  198>>198 01/17/2011  HEENT mild turbinate edema. Oropharynx no thrush or excess pnd or cobblestoning. No JVD or cervical adenopathy. Mild accessory muscle hypertrophy. Trachea midline, nl thryroid. Chest was hyperinflated by percussion with diminished breath sounds and marked increased exp time without wheeze. Hoover sign positive onset of inspiration. Regular rate and rhythm without murmur gallop or rub or increase P2.  Trace edema bilaterally.  Decrease s1s2 Abd: no hsm, nl excursion. Ext warm without cyanosis or clubbing     cxr 11/22/2010 Emphysema/COPD. No acute findings.   Assessment:         Plan:

## 2011-01-17 NOTE — Patient Instructions (Signed)
Continue on current regimen.  Follow med calendar closely and bring to each visit.  follow up Dr. Wert  In 2 months and As needed   

## 2011-01-17 NOTE — Assessment & Plan Note (Signed)
Compensated on present regimen  Patient's medications were reviewed today and patient education was given. Computerized medication calendar was adjusted/completed    

## 2011-01-17 NOTE — Assessment & Plan Note (Signed)
Compensated on present regimen.  No changes in therapy today  Meds reviewed and updated calendar with pt educaiton

## 2011-03-05 ENCOUNTER — Ambulatory Visit (INDEPENDENT_AMBULATORY_CARE_PROVIDER_SITE_OTHER): Payer: Medicare Other | Admitting: Family Medicine

## 2011-03-05 ENCOUNTER — Telehealth: Payer: Self-pay | Admitting: Family Medicine

## 2011-03-05 ENCOUNTER — Encounter: Payer: Self-pay | Admitting: Family Medicine

## 2011-03-05 VITALS — BP 106/70 | HR 64 | Temp 97.4°F | Resp 20 | Ht 69.0 in | Wt 199.0 lb

## 2011-03-05 DIAGNOSIS — R609 Edema, unspecified: Secondary | ICD-10-CM

## 2011-03-05 DIAGNOSIS — G2581 Restless legs syndrome: Secondary | ICD-10-CM

## 2011-03-05 MED ORDER — PRAMIPEXOLE DIHYDROCHLORIDE 0.25 MG PO TABS: 0.2500 mg | ORAL_TABLET | Freq: Every day | ORAL | Status: DC

## 2011-03-05 MED ORDER — HYDROCHLOROTHIAZIDE 12.5 MG PO CAPS: 12.5000 mg | ORAL_CAPSULE | Freq: Every day | ORAL | Status: DC

## 2011-03-05 NOTE — Progress Notes (Signed)
  Subjective:    Patient ID: PHILEMON RIEDESEL, male    DOB: 28-Sep-1926, 77 y.o.   MRN: [id number]  HPI It is ADD 77-year-old male, who comes in today for evaluation of two problems.  He was on Tenoretic and his blood pressure was well controlled and he had no peripheral edema.  He was changed to plain beta-blocker 5 mg daily blood pressure still normal, but now his feet are swollen.  The past year.  He is complaining of leg cramps and now to getting worse.  They keep him up at night.   Review of Systems    General and vascular review of systems otherwise negative Objective:   Physical Exam Well-developed well-nourished man no acute distress.  Examination of the lower extremities shows 2+ edema and some discoloration secondary to cold temperature and venous insufficiency.  Peripheral pulses are palpable 2+.  No evidence of infection       Assessment & Plan:  Peripheral edema, and diuretic salt free diet.  Restless leg syndrome trial of Mirapex .25 nightly

## 2011-03-05 NOTE — Patient Instructions (Signed)
Stay on a salt free diet and take a diuretic tablet, one daily.  Begin the Mirapex, one tablet at bedtime for the restless leg syndrome

## 2011-03-05 NOTE — Telephone Encounter (Signed)
Pts feet are swollen and painful. Red streak running up ankle on rt foot. Possible cellulitis. Pts wife is req call back from nurse today.

## 2011-03-06 LAB — BASIC METABOLIC PANEL
BUN: 20 mg/dL (ref 6–23)
Creatinine, Ser: 1 mg/dL (ref 0.4–1.5)
GFR: 73.5 mL/min (ref >60.00)

## 2011-03-13 ENCOUNTER — Telehealth: Payer: Self-pay | Admitting: Internal Medicine

## 2011-03-13 MED ORDER — OMEPRAZOLE 20 MG PO CPDR: 20.0000 mg | DELAYED_RELEASE_CAPSULE | Freq: Every day | ORAL | Status: DC

## 2011-03-13 NOTE — Telephone Encounter (Signed)
I spoke with mary and needed rx for [facility]ole [facility]. rx has been sent

## 2011-03-26 ENCOUNTER — Ambulatory Visit (INDEPENDENT_AMBULATORY_CARE_PROVIDER_SITE_OTHER): Payer: Medicare Other | Admitting: Family Medicine

## 2011-03-26 ENCOUNTER — Encounter: Payer: Self-pay | Admitting: Family Medicine

## 2011-03-26 DIAGNOSIS — G20 Parkinson's disease: Secondary | ICD-10-CM

## 2011-03-26 DIAGNOSIS — J9610 Chronic respiratory failure, unspecified whether with hypoxia or hypercapnia: Secondary | ICD-10-CM

## 2011-03-26 DIAGNOSIS — J449 Chronic obstructive pulmonary disease, unspecified: Secondary | ICD-10-CM

## 2011-03-26 DIAGNOSIS — I10 Essential (primary) hypertension: Secondary | ICD-10-CM

## 2011-03-26 DIAGNOSIS — E7800 Pure hypercholesterolemia, unspecified: Secondary | ICD-10-CM

## 2011-03-26 DIAGNOSIS — G2581 Restless legs syndrome: Secondary | ICD-10-CM

## 2011-03-26 DIAGNOSIS — N400 Enlarged prostate without lower urinary tract symptoms: Secondary | ICD-10-CM

## 2011-03-26 DIAGNOSIS — Z23 Encounter for immunization: Secondary | ICD-10-CM

## 2011-03-26 DIAGNOSIS — E785 Hyperlipidemia, unspecified: Secondary | ICD-10-CM

## 2011-03-26 DIAGNOSIS — K219 Gastro-esophageal reflux disease without esophagitis: Secondary | ICD-10-CM

## 2011-03-26 LAB — TSH: TSH: 2.28 u[IU]/mL (ref 0.35–5.50)

## 2011-03-26 LAB — POCT URINALYSIS DIPSTICK
Bilirubin, UA: NEGATIVE
Blood, UA: NEGATIVE
Glucose, UA: NEGATIVE
Nitrite, UA: NEGATIVE
Spec Grav, UA: 1.02

## 2011-03-26 LAB — CBC WITH DIFFERENTIAL/PLATELET
Basophils Absolute: 0 10*3/uL (ref 0.0–0.1)
Basophils Relative: 0.5 % (ref 0.0–3.0)
Hemoglobin: 15.9 g/dL (ref 13.0–17.0)
Lymphocytes Relative: 15.8 % (ref 12.0–46.0)
Monocytes Relative: 4.9 % (ref 3.0–12.0)
Neutro Abs: 5.9 10*3/uL (ref 1.4–7.7)
Neutrophils Relative %: 78 % — ABNORMAL HIGH (ref 43.0–77.0)
RBC: 4.81 Mil/uL (ref 4.22–5.81)

## 2011-03-26 LAB — BASIC METABOLIC PANEL
GFR: 74.33 mL/min (ref >60.00)
Glucose, Bld: 96 mg/dL (ref 70–99)
Potassium: 4.1 mEq/L (ref 3.5–5.1)
Sodium: 141 mEq/L (ref 135–145)

## 2011-03-26 LAB — LIPID PANEL
Cholesterol: 158 mg/dL (ref 0–200)
LDL Cholesterol: 89 mg/dL (ref 0–99)

## 2011-03-26 LAB — HEPATIC FUNCTION PANEL
Albumin: 4 g/dL (ref 3.5–5.2)
Total Protein: 6.7 g/dL (ref 6.0–8.3)

## 2011-03-26 MED ORDER — POTASSIUM CHLORIDE 20 MEQ PO PACK: 20.0000 meq | PACK | Freq: Two times a day (BID) | ORAL | Status: DC

## 2011-03-26 MED ORDER — TRIAMTERENE-HCTZ 37.5-25 MG PO TABS: 1.0000 | ORAL_TABLET | Freq: Every day | ORAL | Status: DC

## 2011-03-26 MED ORDER — OMEPRAZOLE 20 MG PO CPDR: 20.0000 mg | DELAYED_RELEASE_CAPSULE | Freq: Every day | ORAL | Status: DC

## 2011-03-26 MED ORDER — PRAVASTATIN SODIUM 20 MG PO TABS: 20.0000 mg | ORAL_TABLET | Freq: Every day | ORAL | Status: DC

## 2011-03-26 NOTE — Patient Instructions (Signed)
Continue your current medications except increase the hydrochlorothiazide to 25 mg daily.  Return one year or sooner if any problems.  Discontinue D. Mirapex.  If the symptoms persist.  Consult with Dr. Anne Hahn the neurologist

## 2011-03-26 NOTE — Progress Notes (Signed)
  Subjective:    Patient ID: [name], male    DOB: 11-02-1926, 77 y.o.   MRN: [id number]  HPI  [name] is an 77 year old, married male ex smoker, who comes in today for Medicare wellness examination because of an underlying history of severe COPD, Parkinson's disease, hypertension, reflux, esophagitis, hyperlipidemia.  His medications were reviewed in detail, and there have been no changes see the medication list.  His only complaint now is that he has strange dreams and nightmares.  His neurologist, increased his Sinemet to one tablet 5 times a day.  He continues to take and Mirapex .25 nightly.  In reviewing his medications.  It is most likely a side effect from the Mirapex, therefore, we will DC that.  Neurologic follow-up if symptoms persist.  He continues to see Dr. Elige Ko on a regular basis for his COPD he feels lung-wise he is stable.  However, he is not walking on a daily basis.  Review of Systems  Constitutional: Negative.   HENT: Negative.   Eyes: Negative.   Respiratory: Negative.   Cardiovascular: Negative.   Gastrointestinal: Negative.   Genitourinary: Negative.   Musculoskeletal: Negative.   Skin: Negative.   Neurological: Negative.   Hematological: Negative.   Psychiatric/Behavioral: Negative.        Objective:   Physical Exam  Constitutional: He is oriented to person, place, and time. He appears well-developed and well-nourished.  HENT:  Head: Normocephalic and atraumatic.  Right Ear: External ear normal.  Left Ear: External ear normal.  Nose: Nose normal.  Mouth/Throat: Oropharynx is clear and moist.  Eyes: Conjunctivae and EOM are normal. Pupils are equal, round, and reactive to light.  Neck: Normal range of motion. Neck supple. No JVD present. No tracheal deviation present. No thyromegaly present.  Cardiovascular: Normal rate, regular rhythm and intact distal pulses.  Exam reveals no gallop and no friction rub.   No murmur heard.      Heart sounds are  extremely distant because of severe underlying COPD and 1+ edema.  Despite being on 12.5 mg of HCTZ daily  Pulmonary/Chest: Effort normal. No stridor. No respiratory distress. He has no wheezes. He has no rales. He exhibits no tenderness.       Mark a decrease in breath sounds.  They are barely audible because of underlying COPD  Abdominal: Soft. Bowel sounds are normal. He exhibits no distension and no mass. There is no tenderness. There is no rebound and no guarding.  Musculoskeletal: Normal range of motion. He exhibits no edema and no tenderness.  Lymphadenopathy:    He has no cervical adenopathy.  Neurological: He is alert and oriented to person, place, and time. He has normal reflexes. No cranial nerve deficit. He exhibits normal muscle tone.  Skin: Skin is warm and dry. No rash noted. No erythema. No pallor.  Psychiatric: He has a normal mood and affect. His behavior is normal. Judgment and thought content normal.          Assessment & Plan:  COPD continue current medications and follow-up in pulmonary.  Parkinson's disease continue Sinemet one tablet 5 times daily.  Fluid retention, peripheral edema, and increased diuretic to 25 mg daily.  Reflux esophagitis.  Continue Prilosec 20 daily.  Hyperlipidemia.  Continue Pravachol 20 daily.  Continue other medications.  Follow-up in one year, sooner if any problem

## 2011-04-02 ENCOUNTER — Ambulatory Visit: Payer: Medicare Other | Admitting: Family Medicine

## 2011-04-03 ENCOUNTER — Telehealth: Payer: Self-pay | Admitting: Family Medicine

## 2011-04-03 DIAGNOSIS — E7800 Pure hypercholesterolemia, unspecified: Secondary | ICD-10-CM

## 2011-04-03 NOTE — Telephone Encounter (Signed)
Please send in a 30 day [facility], until mail order comes. Thanks.

## 2011-04-04 MED ORDER — PRAVASTATIN SODIUM 20 MG PO TABS: 20.0000 mg | ORAL_TABLET | Freq: Every day | ORAL | Status: DC

## 2011-04-09 ENCOUNTER — Encounter: Payer: Self-pay | Admitting: Internal Medicine

## 2011-04-09 ENCOUNTER — Ambulatory Visit (INDEPENDENT_AMBULATORY_CARE_PROVIDER_SITE_OTHER): Payer: Medicare Other | Admitting: Internal Medicine

## 2011-04-09 DIAGNOSIS — J449 Chronic obstructive pulmonary disease, unspecified: Secondary | ICD-10-CM

## 2011-04-09 DIAGNOSIS — J9610 Chronic respiratory failure, unspecified whether with hypoxia or hypercapnia: Secondary | ICD-10-CM

## 2011-04-09 NOTE — Patient Instructions (Signed)
See calendar for specific medication instructions and bring it back for each and every office visit for every healthcare provider you see.  Without it,  you may not receive the best quality medical care that we feel you deserve.  You will note that the calendar groups together  your maintenance  medications that are timed at particular times of the day.  Think of this as your checklist for what your doctor has instructed you to do until your next evaluation to see what benefit  there is  to staying on a consistent group of medications intended to keep you well.  The other group at the bottom is entirely up to you to use as you see fit  for specific symptoms that may arise between visits that require you to treat them on an as needed basis.  Think of this as your action plan or "what if" list.   Separating the top medications from the bottom group is fundamental to providing you adequate care going forward.    Try using the ventolin 5 min  before an activity that you know will make you feel short of breath,  Not after you are short of breath (unless you can't catch your breath)    If you are satisfied with your treatment plan let your doctor know and he/she can either refill your medications or you can return here when your prescription runs out.     If in any way you are not 100% satisfied,  please tell us.  If 100% better, tell your friends!

## 2011-04-09 NOTE — Progress Notes (Signed)
Subjective:     Patient ID: [name], male   DOB: 1927-04-09, 77 y.o.   MRN: [id number]  HPI 16 yowm quit smoking 1980  COPD with an FEV1 42% predicted with a ratio of 38% December 05, 2005,  maintained on Brovana and Pulmicort combination with Spiriva. He  says that he can do pretty much anything he wants to so long as he paces  himself oxygen dependent at bedtime since 2008.  baseline = sob from mb back to house without stopping but once gets to house sit down because it's uphill but can go 10-15 min flat without stopping.   October 21, 2008 ov no change in activity tolerance and sleeping ok on 02 o/w doesn't wear it. rec xopenex but no change in ex tolerance and recovery time so didn't continue it prn    Aug 31, 2009 3 month follow up with PFTs. Pt states breathing is doing "ok" unless he is walking up a hill.  rec you have moderate COPD is early Stage III (Stage IV is the worst) but you're better than you were in 2007  Therefore work harder to maintain conditioning and loose some weight.  Weight control is key  And no change in rx with spiriva/ brov and bud needed.  March 05, 2010 cc doe x walking up to 30 min total around the neighborhood with freqent stops. > no change rx  11/22/2010 ov/[name] cc not able to play golf due to sob. No cough. slt increase in leg swelling all worse x 6 months, indolent onset, minimally progressive. rec Please see patient coordinator before you leave today  to schedule pulmonary rehabilitation and ambulatory 02 at 2lpm  Weight control is simply a matter of calorie balance which needs to be tilted in your favor by eating less and exercising more.  To get the most out of exercise, you need to be continuously aware that you are short of breath, but never out of breath, for 30 minutes daily. As you improve, it will actually be easier for you to do the same amount of exercise  in  30 minutes so always push to the level where you are short of breath.    Be  very careful with salt intake - we will call you with lab results done today to evaluate the leg swelling   01/03/2011 f/u ov/[name] no rehab yet,  Wearing 02 at hs only. Minimal assoc cough/ sense of congestion. Confused with timing of multiple meds Sleeping ok without nocturnal  or early am exac of resp c/o's or need for noct saba.  >>>changed tenoretic to bystolic   01/17/2011 Follow up and Med calendar  Returns for follow up and med review. We reviewed all his meds and updated his med calendar with pt education. It appears he is taking his meds correctly. Last visit tenoretic changed to bystolic. Is tolerating well with no known difficulty. No change in dyspnea level . No wheezing or cough.  rec Continue on current regimen.  Follow med calendar closely and bring to each visit.   04/09/2011 f/u ov/[name] not following med calendar cc doe worse even on 02, wife says she has a system to give him his meds, reviewed [name] last set of recs, she's not following these either and he appears clueless about his meds and is dependent on her.  No variability to sob or assoc cough.  Sleeping ok without nocturnal  or early am exacerbation  of respiratory  c/o's or need  for noct saba. Also denies any obvious fluctuation of symptoms with weather or environmental changes or other aggravating or alleviating factors except as outlined above   ROS  At present neg for  any significant sore throat, dysphagia, itching, sneezing,  nasal congestion or excess/ purulent secretions,  fever, chills, sweats, unintended wt loss, pleuritic or exertional cp, hempoptysis, orthopnea pnd or leg swelling.  Also denies presyncope, palpitations, heartburn, abdominal pain, nausea, vomiting, diarrhea  or change in bowel or urinary habits, dysuria,hematuria,  rash, arthralgias, visual complaints, headache, numbness weakness or ataxia.             Past Medical History:  GERD (ICD-530.81)  HYPERCHOLESTEROLEMIA (ICD-272.0)    PARKINSON'S DISEASE (ICD-332.0)  HYPERLIPIDEMIA, MIXED (ICD-272.2)  HYPERTENSION (ICD-401.9)  HYPERLIPIDEMIA (ICD-272.4)  HEALTH SCREENING (ICD-V70.0)  BENIGN PROSTATIC HYPERTROPHY (ICD-600.00)  Obesity 5/ 8"  - Target wt = 190 for BMI < 30 , 171 for BMI 26  COPD (ICD-496)  - PFT's 11/2005 FEV1 42% with ratio 38% with no improvement after bdilators with DLCO 73%  - PFT's 08/31/09 FEV1 47% with ratio 32% with no better after B2 and DLC0 70  - 09/05/07 overnight pulse ox on ra showed 1:46 min with sat <88  - HFA 25> 50 % October 21, 2008 so changed to neb brov/budesonide  - DPI 100% p coaching June 07, 2010  - Repeat overnight sat ordered June 07, 2009 >> adequate sats on 2lpm (5 min < 89)  - Walking sats RA = ok x 2 laps March 05, 2010> desats p 2 laps 11/22/2010 > resolved on 2lpm --Complex med regimen, med calendar updated 01/17/2011  .  Family History:  Family History of Cardiovascular disorder  neg resp dz/atopy   Social History:  Former Smoker, quit 1980  Alcohol use-yes  Drug use-no  Retired      Objective:   Physical Exam Obese white male with mild resting tremor of the right arm and hand with obvious moderate potbelly contour abdomen and mild hoarseness  wt 202 October 21, 2008 > 206 June 07, 2009 > 202 June 07, 2010 >   04/09/2011  198 HEENT mild turbinate edema. Oropharynx no thrush or excess pnd or cobblestoning. No JVD or cervical adenopathy. Mild accessory muscle hypertrophy. Trachea midline, nl thryroid. Chest was hyperinflated by percussion with diminished breath sounds and marked increased exp time without wheeze. Hoover sign positive onset of inspiration. Regular rate and rhythm without murmur gallop or rub or increase P2.  Trace edema bilaterally.  Decrease s1s2 Abd: no hsm, nl excursion. Ext warm without cyanosis or clubbing     cxr 11/22/2010 Emphysema/COPD. No acute findings.   Assessment:         Plan:

## 2011-04-11 NOTE — Assessment & Plan Note (Addendum)
GOLD III and relatively fixed airflow obstruction clinically s tendency to aecopd  Overall decline is more geriatric than pulmonary - I have no confidence at all that's he's receiving the complex med regimen we have outlined and tried multiple attempts at meaningful medication reconciliation with him and his wife to no avail  Pulmonary f/u can therefore be at Dr Nelida Meuse discretion

## 2011-04-11 NOTE — Assessment & Plan Note (Signed)
-   11/22/2010  Walked RA x 2 laps @ 185 ft each stopped due to  desats > resolved on 2lpm    - 04/09/11 Walked on 2lpm x 3 laps with adequate sats  Nothing else to offer at this point

## 2011-04-17 ENCOUNTER — Other Ambulatory Visit: Payer: Self-pay | Admitting: Family Medicine

## 2011-04-17 ENCOUNTER — Ambulatory Visit (INDEPENDENT_AMBULATORY_CARE_PROVIDER_SITE_OTHER): Payer: Medicare Other | Admitting: Family Medicine

## 2011-04-17 ENCOUNTER — Encounter: Payer: Self-pay | Admitting: Family Medicine

## 2011-04-17 DIAGNOSIS — D045 Carcinoma in situ of skin of trunk: Secondary | ICD-10-CM

## 2011-04-17 NOTE — Progress Notes (Signed)
  Subjective:    Patient ID: [name], male    DOB: 01/23/1927, 77 y.o.   MRN: [id number]  HPI [name] is 77 year old, married male, nonsmoker, who comes in today accompanied by his wife to remove a lesion from his back.  The lesion measures 15 x 15 mm it is in the midline, T8, it is a raised, red, ulcerated, and consistent with what appears to be a squamous cell carcinoma.  The lesion was cleaned with alcohol and anesthetized with 1% Xylocaine with epinephrine.  It was excised with 3-mm margins and sent for pathologic analysis.  The base was cauterized, and it was applied.  He tolerated the procedure.  No complications   Review of Systems    General and dermatologic review of systems otherwise negative except for history of previous skin cancers Objective:   Physical Exam Procedure see above       Assessment & Plan:  15 x 15 mm lesion in the back, clinically appears to be a squamous cell carcinoma excised in toto and sent for pathologic analysis

## 2011-04-17 NOTE — Patient Instructions (Signed)
Remove the Band-Aid tomorrow.  Within two weeks.  We will call you the report 

## 2011-04-26 ENCOUNTER — Telehealth: Payer: Self-pay | Admitting: Family Medicine

## 2011-04-26 NOTE — Telephone Encounter (Signed)
Error

## 2011-05-22 ENCOUNTER — Telehealth: Payer: Self-pay | Admitting: Family Medicine

## 2011-05-22 NOTE — Telephone Encounter (Signed)
Pts spouse called and said that her husband needs refills of arformoterol (BROVANA) 15 MCG/2MLand budesonide (PULMICORT) 0.5 MG/2ML nebulizer solution, , tiotropium (SPIRIVA) 18 MCG inhalation capsule, to [facility].

## 2011-05-23 MED ORDER — BUDESONIDE 0.5 MG/2ML IN SUSP: 0.5000 mg | Freq: Two times a day (BID) | RESPIRATORY_TRACT | Status: DC

## 2011-05-23 MED ORDER — TIOTROPIUM BROMIDE MONOHYDRATE 18 MCG IN CAPS: 18.0000 ug | ORAL_CAPSULE | Freq: Every day | RESPIRATORY_TRACT | Status: DC

## 2011-05-23 MED ORDER — ARFORMOTEROL TARTRATE 15 MCG/2ML IN NEBU: 15.0000 ug | INHALATION_SOLUTION | Freq: Two times a day (BID) | RESPIRATORY_TRACT | Status: DC

## 2011-05-30 ENCOUNTER — Encounter: Payer: Self-pay | Admitting: Family Medicine

## 2011-05-30 ENCOUNTER — Ambulatory Visit (INDEPENDENT_AMBULATORY_CARE_PROVIDER_SITE_OTHER): Payer: Medicare Other | Admitting: Family Medicine

## 2011-05-30 ENCOUNTER — Other Ambulatory Visit: Payer: Self-pay | Admitting: *Deleted

## 2011-05-30 DIAGNOSIS — C44519 Basal cell carcinoma of skin of other part of trunk: Secondary | ICD-10-CM

## 2011-05-30 MED ORDER — BUDESONIDE 0.5 MG/2ML IN SUSP: 0.5000 mg | Freq: Two times a day (BID) | RESPIRATORY_TRACT | Status: DC

## 2011-05-30 MED ORDER — ARFORMOTEROL TARTRATE 15 MCG/2ML IN NEBU: 15.0000 ug | INHALATION_SOLUTION | Freq: Two times a day (BID) | RESPIRATORY_TRACT | Status: DC

## 2011-05-30 NOTE — Progress Notes (Signed)
  Subjective:    Patient ID: [name], male    DOB: 02-25-1927, 78 y.o.   MRN: [id number]  HPI [name] is an 78 year old married male nonsmoker who comes in today accompanied by his wife and daughter for reexcision of a basal cell carcinoma  He we removed a basal cell carcinoma from his back about 6 weeks ago however the margins were involved and therefore he comes back today for wide reexcision.  After informed consent the lesion was anesthetized with 1% Xylocaine. It was excised with 3 mm margins and sent for pathologic analysis. The bases cauterized he tolerated the procedure well no complication  Size of the excision was 13 mm x 13 mm   Review of Systems    general and dermatologic review of systems otherwise negative Objective:   Physical Exam Procedure see above       Assessment & Plan:  Basal cell carcinoma

## 2011-05-30 NOTE — Patient Instructions (Signed)
Local wound care  We will call you within 2 weeks of the report

## 2011-06-14 ENCOUNTER — Telehealth: Payer: Self-pay | Admitting: Family Medicine

## 2011-06-14 MED ORDER — BUDESONIDE 0.5 MG/2ML IN SUSP: 0.5000 mg | Freq: Two times a day (BID) | RESPIRATORY_TRACT | Status: DC

## 2011-06-14 MED ORDER — ARFORMOTEROL TARTRATE 15 MCG/2ML IN NEBU: 15.0000 ug | INHALATION_SOLUTION | Freq: Two times a day (BID) | RESPIRATORY_TRACT | Status: DC

## 2011-06-14 NOTE — Telephone Encounter (Signed)
Pt needs refills on budesonide and Brovana. [facility] is late getting it to them and they will run out before it comes. Please call in to [address] [facility]. Thanks! Please call wife when done.

## 2011-06-25 ENCOUNTER — Telehealth: Payer: Self-pay | Admitting: Family Medicine

## 2011-06-25 NOTE — Telephone Encounter (Signed)
Spouse is calling to see if the patient should continue his HCTZ because he is taking triam/HCTZ?  Okay to leave a message.

## 2011-06-25 NOTE — Telephone Encounter (Signed)
Left detailed message on machine for patient 

## 2011-06-25 NOTE — Telephone Encounter (Signed)
Not both

## 2011-06-25 NOTE — Telephone Encounter (Signed)
Spouse would like [name] to return concerning bp med

## 2011-08-08 ENCOUNTER — Telehealth: Payer: Self-pay | Admitting: Internal Medicine

## 2011-08-08 NOTE — Telephone Encounter (Signed)
Needs ov to regroup before d/c 02 based on last eval here

## 2011-08-08 NOTE — Telephone Encounter (Signed)
Spoke with patients wife-states [facility] has not refilled tanks in sometime for patient and that he really isn't using O2 and would like an order placed for [facility] to come pick up. I advised that MW will need to give the okay to place the order and he is not in the office until the morning. Will call once MW advises.

## 2011-08-09 NOTE — Telephone Encounter (Signed)
I spoke with spouse and she states she will speak with her husband and call back to make an apt. Will sign off message

## 2011-09-23 ENCOUNTER — Telehealth: Payer: Self-pay | Admitting: Internal Medicine

## 2011-09-23 NOTE — Telephone Encounter (Signed)
Spoke to pt and scheduled appt to be evaluated for d/c of o2

## 2011-10-09 ENCOUNTER — Encounter: Payer: Self-pay | Admitting: Internal Medicine

## 2011-10-09 ENCOUNTER — Ambulatory Visit (INDEPENDENT_AMBULATORY_CARE_PROVIDER_SITE_OTHER): Payer: Medicare Other | Admitting: Internal Medicine

## 2011-10-09 VITALS — BP 118/74 | HR 60 | Temp 97.8°F | Ht 69.0 in | Wt 198.2 lb

## 2011-10-09 DIAGNOSIS — J9610 Chronic respiratory failure, unspecified whether with hypoxia or hypercapnia: Secondary | ICD-10-CM

## 2011-10-09 DIAGNOSIS — J449 Chronic obstructive pulmonary disease, unspecified: Secondary | ICD-10-CM

## 2011-10-09 NOTE — Assessment & Plan Note (Signed)
-   PFT's 11/2005 FEV1 42% with ratio 38% with no improvement after bdilators with DLCO 73%  - PFT's 08/31/09 FEV1 47% with ratio 32% with no better after B2 and DLC0 70  - HFA 25> 50 % October 21, 2008 so changed to neb brov/budesonide  - DPI 100% p coaching June 07, 2010  - Referred to Rehab 11/22/2010  And again 10/09/2011   GOLD III and 02 dep with heaviest exertion, would definitely benefit from rehab if can work out the details - no change in rx in meantim    Each maintenance medication was reviewed in detail including most importantly the difference between maintenance and as needed and under what circumstances the prns are to be used.  Please see instructions for details which were reviewed in writing and the patient given a copy.

## 2011-10-09 NOTE — Progress Notes (Signed)
Subjective:     Patient ID: [name], male   DOB: 1926/09/27  MRN: [id number]  Brief patient profile:  84  yowm quit smoking 1980  COPD with an FEV1 42% predicted with a ratio of 38% December 05, 2005,  maintained on Brovana and Pulmicort combination with Spiriva. He  says that he can do pretty much anything he wants to so long as he paces  himself oxygen dependent at bedtime since 2008.  baseline = sob from mb back to house without stopping but once gets to house sit down because it's uphill but can go 10-15 min flat without stopping.    HPI 11/22/2010 ov/Blayde Bacigalupi cc not able to play golf due to sob. No cough. slt increase in leg swelling all worse x 6 months, indolent onset, minimally progressive. rec Please see patient coordinator before you leave today  to schedule pulmonary rehabilitation and ambulatory 02 at 2lpm > never went to rehab " scheduling issue"   01/03/2011 f/u ov/Finnley Lewis no rehab yet,  Wearing 02 at hs only. Minimal assoc cough/ sense of congestion. Confused with timing of multiple meds Sleeping ok without nocturnal  or early am exac of resp c/o's or need for noct saba.  >>>changed tenoretic to bystolic   01/17/2011 Follow up and Med calendar  Returns for follow up and med review. We reviewed all his meds and updated his med calendar with pt education. It appears he is taking his meds correctly. Last visit tenoretic changed to bystolic. Is tolerating well with no known difficulty. No change in dyspnea level . No wheezing or cough.  rec Continue on current regimen.  Follow med calendar closely and bring to each visit.   04/09/2011 f/u ov/Lynnea Vandervoort not following med calendar cc doe worse even on 02, wife says she has a system to give him his meds, reviewed Dr Nelida Meuse last set of recs, she's not following these either and he appears clueless about his meds and is dependent on her.  No variability to sob or assoc cough. rec See calendar for specific medication instructions  Try using  the ventolin 5 min  before an activity that you know will make you feel short of breath,  Not after you are short of breath (unless you can't catch your breath)       10/09/2011 f/u ov/Rose Hegner cc doe x exertion but not adl's x in shower.  No real change x one year in terms of activity tol, some better p ventolin before walks, some better with amb 02, no significant variability. No overt sinus or hb symptoms.  Sleeping ok without nocturnal  or early am exacerbation  of respiratory  c/o's or need for noct saba. Also denies any obvious fluctuation of symptoms with weather or environmental changes or other aggravating or alleviating factors except as outlined above   ROS  At present neg for  any significant sore throat, dysphagia, itching, sneezing,  nasal congestion or excess/ purulent secretions,  fever, chills, sweats, unintended wt loss, pleuritic or exertional cp, hempoptysis, orthopnea pnd or leg swelling.  Also denies presyncope, palpitations, heartburn, abdominal pain, nausea, vomiting, diarrhea  or change in bowel or urinary habits, dysuria,hematuria,  rash, arthralgias, visual complaints, headache, numbness weakness or ataxia.             Past Medical History:  GERD (ICD-530.81)  HYPERCHOLESTEROLEMIA (ICD-272.0)  PARKINSON'S DISEASE (ICD-332.0)  HYPERLIPIDEMIA, MIXED (ICD-272.2)  HYPERTENSION (ICD-401.9)  HYPERLIPIDEMIA (ICD-272.4)  HEALTH SCREENING (ICD-V70.0)  BENIGN PROSTATIC HYPERTROPHY (ICD-600.00)  Obesity  5/ 8"  - Target wt = 190 for BMI < 30 , 171 for BMI 26  COPD (ICD-496)  - PFT's 11/2005 FEV1 42% with ratio 38% with no improvement after bdilators with DLCO 73%  - PFT's 08/31/09 FEV1 47% with ratio 32% with no better after B2 and DLC0 70  - 09/05/07 overnight pulse ox on ra showed 1:46 min with sat <88  - HFA 25> 50 % October 21, 2008 so changed to neb brov/budesonide  - DPI 100% p coaching June 07, 2010  - Repeat overnight sat ordered June 07, 2009 >> adequate sats  on 2lpm (5 min < 89)  - Walking sats RA = ok x 2 laps March 05, 2010> desats p 2 laps 11/22/2010 > resolved on 2lpm --Complex med regimen, med calendar updated 01/17/2011  .  Family History:  Family History of Cardiovascular disorder  neg resp dz/atopy   Social History:  Former Smoker, quit 1980  Alcohol use-yes  Drug use-no  Retired      Objective:   Physical Exam Obese white male with mild resting tremor of the right arm and hand with obvious moderate potbelly contour abdomen and mild hoarseness  wt 202 October 21, 2008 > 206 June 07, 2009>   04/09/2011  198 > 10/09/2011  198  HEENT mild turbinate edema. Oropharynx no thrush or excess pnd or cobblestoning. No JVD or cervical adenopathy. Mild accessory muscle hypertrophy. Trachea midline, nl thryroid. Chest was hyperinflated by percussion with diminished breath sounds and marked increased exp time without wheeze. Hoover sign positive onset of inspiration. Regular rate and rhythm without murmur gallop or rub or increase P2.  Trace edema bilaterally.  Decrease s1s2 Abd: no hsm, nl excursion. Ext warm without cyanosis or clubbing     cxr 11/22/2010 Emphysema/COPD. No acute findings.   Assessment:         Plan:

## 2011-10-09 NOTE — Patient Instructions (Addendum)
Preferable to wear the 02 with heavy exertion but always wear it at bedtime  The alternative to 02 is to use the ventolin 5 min before exercise and pace yourself  Please see patient coordinator before you leave today  to schedule pulmonary rehab and alternatives for portable 02 through advanced  Follow up can be as needed.

## 2011-10-09 NOTE — Assessment & Plan Note (Addendum)
-   09/05/07 overnight pulse ox on ra showed 1:46 min with sat <88   - Repeat overnight sat ordered June 07, 2009 >> adequate sats on 2lpm (5 min < 89)    - 11/22/2010  Walked RA x 2 laps @ 185 ft each stopped due to  desats > resolved on 2lpm    - 04/09/11 Walked on 2lpm x 3 laps with adequate sats    - 10/09/2011  Walked RA x 3 laps @ 185 ft each stopped due to desat to 87% last lap  I had an extended discussion with the patient and fm today lasting 15 to 20 minutes of a 25 minute visit on the following issues:   Not likely to improve x with wt loss so to the extent that wearing 02 will help him be more active and burn fat, he really would benefit from continuing to wear it with exertion.

## 2011-10-11 ENCOUNTER — Telehealth: Payer: Self-pay | Admitting: Internal Medicine

## 2011-10-11 NOTE — Telephone Encounter (Signed)
I spoke with Jacki Cones and she states pt had called to get his oxygen tanks refilled through [facility] and was told they needed an order for this. I called [facility] and spoke with Candace and was advised pt has machine ifill machine in the home that fills up his tanks and his portable tank. I advise laurie of this and requested [facility] # to call them and speak with them directly. I gave her the #. She needed nothing further from [place]

## 2011-10-16 ENCOUNTER — Encounter (HOSPITAL_COMMUNITY): Payer: Self-pay

## 2011-10-16 ENCOUNTER — Encounter (HOSPITAL_COMMUNITY)
Admission: RE | Admit: 2011-10-16 | Discharge: 2011-10-16 | Disposition: A | Discharge status: Home / Self Care | Payer: Medicare Other | Source: Ambulatory Visit | Attending: Internal Medicine | Admitting: Internal Medicine

## 2011-10-16 DIAGNOSIS — N400 Enlarged prostate without lower urinary tract symptoms: Secondary | ICD-10-CM | POA: Insufficient documentation

## 2011-10-16 DIAGNOSIS — E782 Mixed hyperlipidemia: Secondary | ICD-10-CM | POA: Insufficient documentation

## 2011-10-16 DIAGNOSIS — R609 Edema, unspecified: Secondary | ICD-10-CM | POA: Insufficient documentation

## 2011-10-16 DIAGNOSIS — K219 Gastro-esophageal reflux disease without esophagitis: Secondary | ICD-10-CM | POA: Insufficient documentation

## 2011-10-16 DIAGNOSIS — J9610 Chronic respiratory failure, unspecified whether with hypoxia or hypercapnia: Secondary | ICD-10-CM | POA: Insufficient documentation

## 2011-10-16 DIAGNOSIS — Z5189 Encounter for other specified aftercare: Secondary | ICD-10-CM | POA: Insufficient documentation

## 2011-10-16 DIAGNOSIS — J4489 Other specified chronic obstructive pulmonary disease: Secondary | ICD-10-CM | POA: Insufficient documentation

## 2011-10-16 DIAGNOSIS — J449 Chronic obstructive pulmonary disease, unspecified: Secondary | ICD-10-CM | POA: Insufficient documentation

## 2011-10-16 DIAGNOSIS — E7800 Pure hypercholesterolemia, unspecified: Secondary | ICD-10-CM | POA: Insufficient documentation

## 2011-10-16 DIAGNOSIS — I10 Essential (primary) hypertension: Secondary | ICD-10-CM | POA: Insufficient documentation

## 2011-10-16 DIAGNOSIS — G20A1 Parkinson's disease without dyskinesia, without mention of fluctuations: Secondary | ICD-10-CM | POA: Insufficient documentation

## 2011-10-16 DIAGNOSIS — G20 Parkinson's disease: Secondary | ICD-10-CM | POA: Insufficient documentation

## 2011-10-16 DIAGNOSIS — G2581 Restless legs syndrome: Secondary | ICD-10-CM | POA: Insufficient documentation

## 2011-10-16 NOTE — Progress Notes (Signed)
Mr. Bueche came in today for orientation to Pulmonary Rehab.  Health history and medications reviewed with patient and wife.  6 min walk test done, oxygen saturations ranged from 95 % at start of test to 88% at end of test.  Goals for rehab discussed with patient. Demonstration and practice of PLB using pulse oximeter.  Patient able to return demonstration satisfactorily. Safety and hand hygiene in the exercise area reviewed with patient.  Patient voices understanding.  He plans to start exercise on 10-17-11.

## 2011-10-17 ENCOUNTER — Encounter (HOSPITAL_COMMUNITY)
Admission: RE | Admit: 2011-10-17 | Discharge: 2011-10-17 | Disposition: A | Discharge status: Home / Self Care | Payer: Medicare Other | Source: Ambulatory Visit | Attending: Internal Medicine | Admitting: Internal Medicine

## 2011-10-17 NOTE — Progress Notes (Signed)
First day of exercise in Pulmonary Rehab.  [name] was oriented to equipment use and safety, RPE and Dyspnea scale and rest breaks.  Tolerated exercise well, vital signs stable.  Will continue to encourage and support.

## 2011-10-22 ENCOUNTER — Encounter (HOSPITAL_COMMUNITY): Payer: Medicare Other

## 2011-10-24 ENCOUNTER — Encounter (HOSPITAL_COMMUNITY): Payer: Medicare Other

## 2011-10-24 DIAGNOSIS — I10 Essential (primary) hypertension: Secondary | ICD-10-CM | POA: Insufficient documentation

## 2011-10-24 DIAGNOSIS — E782 Mixed hyperlipidemia: Secondary | ICD-10-CM | POA: Insufficient documentation

## 2011-10-24 DIAGNOSIS — E7800 Pure hypercholesterolemia, unspecified: Secondary | ICD-10-CM | POA: Insufficient documentation

## 2011-10-24 DIAGNOSIS — J9610 Chronic respiratory failure, unspecified whether with hypoxia or hypercapnia: Secondary | ICD-10-CM | POA: Insufficient documentation

## 2011-10-24 DIAGNOSIS — N400 Enlarged prostate without lower urinary tract symptoms: Secondary | ICD-10-CM | POA: Insufficient documentation

## 2011-10-24 DIAGNOSIS — K219 Gastro-esophageal reflux disease without esophagitis: Secondary | ICD-10-CM | POA: Insufficient documentation

## 2011-10-24 DIAGNOSIS — G20A1 Parkinson's disease without dyskinesia, without mention of fluctuations: Secondary | ICD-10-CM | POA: Insufficient documentation

## 2011-10-24 DIAGNOSIS — Z5189 Encounter for other specified aftercare: Secondary | ICD-10-CM | POA: Insufficient documentation

## 2011-10-24 DIAGNOSIS — R609 Edema, unspecified: Secondary | ICD-10-CM | POA: Insufficient documentation

## 2011-10-24 DIAGNOSIS — J4489 Other specified chronic obstructive pulmonary disease: Secondary | ICD-10-CM | POA: Insufficient documentation

## 2011-10-24 DIAGNOSIS — G20 Parkinson's disease: Secondary | ICD-10-CM | POA: Insufficient documentation

## 2011-10-24 DIAGNOSIS — G2581 Restless legs syndrome: Secondary | ICD-10-CM | POA: Insufficient documentation

## 2011-10-24 DIAGNOSIS — J449 Chronic obstructive pulmonary disease, unspecified: Secondary | ICD-10-CM | POA: Insufficient documentation

## 2011-10-29 ENCOUNTER — Encounter (HOSPITAL_COMMUNITY): Payer: Medicare Other

## 2011-10-31 ENCOUNTER — Encounter (HOSPITAL_COMMUNITY): Payer: Medicare Other

## 2011-11-05 ENCOUNTER — Encounter (HOSPITAL_COMMUNITY)
Admission: RE | Admit: 2011-11-05 | Discharge: 2011-11-05 | Disposition: A | Discharge status: Home / Self Care | Payer: Medicare Other | Source: Ambulatory Visit | Attending: Internal Medicine | Admitting: Internal Medicine

## 2011-11-07 ENCOUNTER — Encounter (HOSPITAL_COMMUNITY)
Admission: RE | Admit: 2011-11-07 | Discharge: 2011-11-07 | Disposition: A | Discharge status: Home / Self Care | Payer: Medicare Other | Source: Ambulatory Visit | Attending: Internal Medicine | Admitting: Internal Medicine

## 2011-11-07 NOTE — Progress Notes (Signed)
[name] 78 y.o. male Nutrition Note Spoke with pt. Pt is overweight. Pt reports he would like to lose 15#. Per discussion, pt is walking and exercising in rehab to help promote wt loss. Pt has not made any changes to his eating habits at this time. Wt loss tips briefly reviewed. Pt eats 3 meals a day; most meals prepared at home.  Making healthy food choices the majority of the time.  Pt's Rate Your Plate results reviewed with pt.  Pt expressed understanding.  Pt does not avoid salty food; uses canned/ convenience food.  Pt does not add salt to food.  The role of sodium in lung disease reviewed with pt. Per nutrition screen, pt c/o constipation. Pt states he is constipated about once every 2 months and Ex-lax relieves constipation. Increasing fluid and fiber intake discussed. Pt expressed understanding of all the above information reviewed. Nutrition Diagnosis   Excessive sodium intake related to over consumption of processed food as evidenced by frequent consumption of convenience food/ canned vegetables.   Food-and nutrition-related knowledge deficit related to lack of exposure to information as related to diagnosis of pulmonary disease   Overweight related to excessive energy intake as evidenced by a BMI of 29.4 Nutrition Rx/Est. Daily Nutrition Needs for: ? wt loss 1350-1850 Kcal  110-125 gm protein   1500 mg or less sodium      Nutrition Intervention   Pt's individual nutrition plan and goals reviewed with pt.   Benefits of adopting healthy eating habits discussed when pt's Rate Your Plate reviewed.   Pt to attend the Nutrition and Lung Disease class   Continual client-centered nutrition education by RD, as part of interdisciplinary care. Goal(s) 1. Pt to identify and limit food sources of sodium. 2. Identify food quantities necessary to achieve wt loss of  -2# per week to a goal wt of 77.6-85.5 kg (170-188 lb) at graduation from pulmonary rehab. Monitor and Evaluate progress toward  nutrition goal with team.    Pulmonary Rehab Nutrition Screen  [name] 78 y.o. male             Ht: 68.25" Ht Readings from Last 1 Encounters:  10/09/11 5\' 9"  (1.753 m)    Wt:   194.7 lb (88.5 kg) Wt Readings from Last 3 Encounters:  10/09/11 198 lb 3.2 oz (89.903 kg)  04/09/11 198 lb (89.812 kg)  03/26/11 199 lb (90.266 kg)    BMI: 29.4  30.8%body fat                       Rate Your Plate Score: 56  Please answer the following questions:             YES  NO    Do you live in a nursing home?  X   Do you eat out more than 3 times per week?    X If yes, how many times per week do you eat out?   Do you have food allergies?   X If yes, what are you allergic to?  Have you gained or lost more than 10 lbs without trying?               X If yes, how much weight have you  lost or gained?  lbs over  weeks/mo   Do you want to lose weight?    X  If yes, what is a goal weight or amount of weight you would like to lose?  15 lbs  Do you eat alone most of the time?   X   Do you eat less than 2 meals/day?  X If yes, how many meals do you eat?  Do you use canned and convenience food? X  Canned green beans, white beans. Marie Calendar meals eaten weekly.  Do you use a salt shaker?  X   Do you drink more than 3 alcoholic drinks/day?  X If yes, how many drinks per day?  Are you having trouble with constipation? * X  If yes, what are you doing to help relieve constipation? Ex Lax taken once q 2 months  Do you have financial difficulties with buying food? *  X   Do you usually need help with grocery shopping or with cooking? *  X   Do you have a poor appetite? *                                       X   Do you have trouble chewing/ swallowing? *   X   Do you take vitamin and mineral or herbal supplements? *  X If yes, what kind of supplements do you currently take?    Past Medical History  Diagnosis Date  . GERD (gastroesophageal reflux disease)   . Pure hypercholesterolemia   . Paralysis  agitans   . Mixed hyperlipidemia   . Unspecified essential hypertension   . Hypertrophy of prostate without urinary obstruction and other lower urinary tract symptoms (LUTS)   . Obesity, unspecified   . Chronic airway obstruction, not elsewhere classified    Labs Lipid Panel     Component Value Date/Time   CHOL 158 03/26/2011 1214   TRIG 118.0 03/26/2011 1214   HDL 45.70 03/26/2011 1214   CHOLHDL 3 03/26/2011 1214   VLDL 23.6 03/26/2011 1214   LDLCALC 89 03/26/2011 1214   No results found for this basename: HGBA1C   Nutrition Risk Level:  Low

## 2011-11-12 ENCOUNTER — Encounter (HOSPITAL_COMMUNITY)
Admission: RE | Admit: 2011-11-12 | Discharge: 2011-11-12 | Disposition: A | Discharge status: Home / Self Care | Payer: Medicare Other | Source: Ambulatory Visit | Attending: Internal Medicine | Admitting: Internal Medicine

## 2011-11-14 ENCOUNTER — Encounter (HOSPITAL_COMMUNITY)
Admission: RE | Admit: 2011-11-14 | Discharge: 2011-11-14 | Disposition: A | Discharge status: Home / Self Care | Payer: Medicare Other | Source: Ambulatory Visit | Attending: Internal Medicine | Admitting: Internal Medicine

## 2011-11-19 ENCOUNTER — Encounter (HOSPITAL_COMMUNITY)
Admission: RE | Admit: 2011-11-19 | Discharge: 2011-11-19 | Disposition: A | Discharge status: Home / Self Care | Payer: Medicare Other | Source: Ambulatory Visit | Attending: Internal Medicine | Admitting: Internal Medicine

## 2011-11-21 ENCOUNTER — Encounter (HOSPITAL_COMMUNITY)
Admission: RE | Admit: 2011-11-21 | Discharge: 2011-11-21 | Disposition: A | Discharge status: Home / Self Care | Payer: Medicare Other | Source: Ambulatory Visit | Attending: Internal Medicine | Admitting: Internal Medicine

## 2011-11-21 DIAGNOSIS — G20 Parkinson's disease: Secondary | ICD-10-CM | POA: Insufficient documentation

## 2011-11-21 DIAGNOSIS — E782 Mixed hyperlipidemia: Secondary | ICD-10-CM | POA: Insufficient documentation

## 2011-11-21 DIAGNOSIS — Z5189 Encounter for other specified aftercare: Secondary | ICD-10-CM | POA: Insufficient documentation

## 2011-11-21 DIAGNOSIS — E7800 Pure hypercholesterolemia, unspecified: Secondary | ICD-10-CM | POA: Insufficient documentation

## 2011-11-21 DIAGNOSIS — K219 Gastro-esophageal reflux disease without esophagitis: Secondary | ICD-10-CM | POA: Insufficient documentation

## 2011-11-21 DIAGNOSIS — G20A1 Parkinson's disease without dyskinesia, without mention of fluctuations: Secondary | ICD-10-CM | POA: Insufficient documentation

## 2011-11-21 DIAGNOSIS — J4489 Other specified chronic obstructive pulmonary disease: Secondary | ICD-10-CM | POA: Insufficient documentation

## 2011-11-21 DIAGNOSIS — G2581 Restless legs syndrome: Secondary | ICD-10-CM | POA: Insufficient documentation

## 2011-11-21 DIAGNOSIS — I10 Essential (primary) hypertension: Secondary | ICD-10-CM | POA: Insufficient documentation

## 2011-11-21 DIAGNOSIS — J449 Chronic obstructive pulmonary disease, unspecified: Secondary | ICD-10-CM | POA: Insufficient documentation

## 2011-11-21 DIAGNOSIS — R609 Edema, unspecified: Secondary | ICD-10-CM | POA: Insufficient documentation

## 2011-11-21 DIAGNOSIS — N400 Enlarged prostate without lower urinary tract symptoms: Secondary | ICD-10-CM | POA: Insufficient documentation

## 2011-11-21 NOTE — Progress Notes (Signed)
Completed home exercise with patient. Reviewed exercise progression, routine, exercising at a comfortable pace, RPE/Dyspnea scales, how important it is to own a pulse oximeter and how to use one, weather conditions, warning signs and symptoms with exercise, and CP/NTG. We discussed when to call MD. Patient voices understanding. Patient has a goal to breathe better while doing ADLs. Will continue to encourage and support.  Dominiqua Cooner L. Brown, MS, NASM, CES 

## 2011-11-26 ENCOUNTER — Encounter (HOSPITAL_COMMUNITY)
Admission: RE | Admit: 2011-11-26 | Discharge: 2011-11-26 | Disposition: A | Discharge status: Home / Self Care | Payer: Medicare Other | Source: Ambulatory Visit | Attending: Internal Medicine | Admitting: Internal Medicine

## 2011-11-28 ENCOUNTER — Encounter (HOSPITAL_COMMUNITY)
Admission: RE | Admit: 2011-11-28 | Discharge: 2011-11-28 | Disposition: A | Discharge status: Home / Self Care | Payer: Medicare Other | Source: Ambulatory Visit | Attending: Internal Medicine | Admitting: Internal Medicine

## 2011-12-03 ENCOUNTER — Encounter (HOSPITAL_COMMUNITY)
Admission: RE | Admit: 2011-12-03 | Discharge: 2011-12-03 | Disposition: A | Discharge status: Home / Self Care | Payer: Medicare Other | Source: Ambulatory Visit | Attending: Internal Medicine | Admitting: Internal Medicine

## 2011-12-05 ENCOUNTER — Encounter (HOSPITAL_COMMUNITY): Payer: Medicare Other

## 2011-12-10 ENCOUNTER — Encounter (HOSPITAL_COMMUNITY)
Admission: RE | Admit: 2011-12-10 | Discharge: 2011-12-10 | Disposition: A | Discharge status: Home / Self Care | Payer: Medicare Other | Source: Ambulatory Visit | Attending: Internal Medicine | Admitting: Internal Medicine

## 2011-12-12 ENCOUNTER — Encounter (HOSPITAL_COMMUNITY)
Admission: RE | Admit: 2011-12-12 | Discharge: 2011-12-12 | Disposition: A | Discharge status: Home / Self Care | Payer: Medicare Other | Source: Ambulatory Visit | Attending: Physician Assistant | Admitting: Physician Assistant

## 2011-12-17 ENCOUNTER — Encounter (HOSPITAL_COMMUNITY)
Admission: RE | Admit: 2011-12-17 | Discharge: 2011-12-17 | Disposition: A | Discharge status: Home / Self Care | Payer: Medicare Other | Source: Ambulatory Visit | Attending: Internal Medicine | Admitting: Internal Medicine

## 2011-12-19 ENCOUNTER — Encounter (HOSPITAL_COMMUNITY)
Admission: RE | Admit: 2011-12-19 | Discharge: 2011-12-19 | Disposition: A | Discharge status: Home / Self Care | Payer: Medicare Other | Source: Ambulatory Visit | Attending: Internal Medicine | Admitting: Internal Medicine

## 2011-12-24 ENCOUNTER — Encounter (HOSPITAL_COMMUNITY)
Admission: RE | Admit: 2011-12-24 | Discharge: 2011-12-24 | Disposition: A | Discharge status: Home / Self Care | Payer: Medicare Other | Source: Ambulatory Visit | Attending: Internal Medicine | Admitting: Internal Medicine

## 2011-12-24 DIAGNOSIS — E782 Mixed hyperlipidemia: Secondary | ICD-10-CM | POA: Insufficient documentation

## 2011-12-24 DIAGNOSIS — J4489 Other specified chronic obstructive pulmonary disease: Secondary | ICD-10-CM | POA: Insufficient documentation

## 2011-12-24 DIAGNOSIS — G2581 Restless legs syndrome: Secondary | ICD-10-CM | POA: Insufficient documentation

## 2011-12-24 DIAGNOSIS — N400 Enlarged prostate without lower urinary tract symptoms: Secondary | ICD-10-CM | POA: Insufficient documentation

## 2011-12-24 DIAGNOSIS — I10 Essential (primary) hypertension: Secondary | ICD-10-CM | POA: Insufficient documentation

## 2011-12-24 DIAGNOSIS — E7800 Pure hypercholesterolemia, unspecified: Secondary | ICD-10-CM | POA: Insufficient documentation

## 2011-12-24 DIAGNOSIS — J449 Chronic obstructive pulmonary disease, unspecified: Secondary | ICD-10-CM | POA: Insufficient documentation

## 2011-12-24 DIAGNOSIS — K219 Gastro-esophageal reflux disease without esophagitis: Secondary | ICD-10-CM | POA: Insufficient documentation

## 2011-12-24 DIAGNOSIS — G20A1 Parkinson's disease without dyskinesia, without mention of fluctuations: Secondary | ICD-10-CM | POA: Insufficient documentation

## 2011-12-24 DIAGNOSIS — G20 Parkinson's disease: Secondary | ICD-10-CM | POA: Insufficient documentation

## 2011-12-24 DIAGNOSIS — R609 Edema, unspecified: Secondary | ICD-10-CM | POA: Insufficient documentation

## 2011-12-24 DIAGNOSIS — Z5189 Encounter for other specified aftercare: Secondary | ICD-10-CM | POA: Insufficient documentation

## 2011-12-26 ENCOUNTER — Encounter (HOSPITAL_COMMUNITY)
Admission: RE | Admit: 2011-12-26 | Discharge: 2011-12-26 | Disposition: A | Discharge status: Home / Self Care | Payer: Medicare Other | Source: Ambulatory Visit | Attending: Internal Medicine | Admitting: Internal Medicine

## 2011-12-31 ENCOUNTER — Encounter (HOSPITAL_COMMUNITY): Payer: Medicare Other

## 2012-01-02 ENCOUNTER — Encounter (HOSPITAL_COMMUNITY): Payer: Medicare Other

## 2012-01-07 ENCOUNTER — Encounter (HOSPITAL_COMMUNITY): Payer: Medicare Other

## 2012-01-09 ENCOUNTER — Encounter (HOSPITAL_COMMUNITY)
Admission: RE | Admit: 2012-01-09 | Discharge: 2012-01-09 | Disposition: A | Discharge status: Home / Self Care | Payer: Medicare Other | Source: Ambulatory Visit | Attending: Internal Medicine | Admitting: Internal Medicine

## 2012-01-14 ENCOUNTER — Encounter (HOSPITAL_COMMUNITY)
Admission: RE | Admit: 2012-01-14 | Discharge: 2012-01-14 | Disposition: A | Discharge status: Home / Self Care | Payer: Medicare Other | Source: Ambulatory Visit | Attending: Internal Medicine | Admitting: Internal Medicine

## 2012-01-16 ENCOUNTER — Encounter (HOSPITAL_COMMUNITY)
Admission: RE | Admit: 2012-01-16 | Discharge: 2012-01-16 | Disposition: A | Discharge status: Home / Self Care | Payer: Medicare Other | Source: Ambulatory Visit | Attending: Internal Medicine | Admitting: Internal Medicine

## 2012-01-17 ENCOUNTER — Other Ambulatory Visit: Payer: Self-pay | Admitting: Family Medicine

## 2012-01-20 ENCOUNTER — Telehealth: Payer: Self-pay | Admitting: Family Medicine

## 2012-01-20 NOTE — Telephone Encounter (Signed)
Next week

## 2012-01-20 NOTE — Telephone Encounter (Signed)
Pts spouse called and said that pt has been in rehab for parkinson and pulmonary. Need to know when Dr Tawanna Cooler would like for pt to come in for a fup ov? Also called to check on status of Spirava.

## 2012-01-20 NOTE — Telephone Encounter (Signed)
Spoke with wife and she will call back for a 30 minute appointment

## 2012-01-21 ENCOUNTER — Encounter (HOSPITAL_COMMUNITY)
Admission: RE | Admit: 2012-01-21 | Discharge: 2012-01-21 | Disposition: A | Discharge status: Home / Self Care | Payer: Medicare Other | Source: Ambulatory Visit | Attending: Internal Medicine | Admitting: Internal Medicine

## 2012-01-21 DIAGNOSIS — E7800 Pure hypercholesterolemia, unspecified: Secondary | ICD-10-CM | POA: Insufficient documentation

## 2012-01-21 DIAGNOSIS — K219 Gastro-esophageal reflux disease without esophagitis: Secondary | ICD-10-CM | POA: Insufficient documentation

## 2012-01-21 DIAGNOSIS — G2581 Restless legs syndrome: Secondary | ICD-10-CM | POA: Insufficient documentation

## 2012-01-21 DIAGNOSIS — J449 Chronic obstructive pulmonary disease, unspecified: Secondary | ICD-10-CM | POA: Insufficient documentation

## 2012-01-21 DIAGNOSIS — E782 Mixed hyperlipidemia: Secondary | ICD-10-CM | POA: Insufficient documentation

## 2012-01-21 DIAGNOSIS — Z5189 Encounter for other specified aftercare: Secondary | ICD-10-CM | POA: Insufficient documentation

## 2012-01-21 DIAGNOSIS — G20A1 Parkinson's disease without dyskinesia, without mention of fluctuations: Secondary | ICD-10-CM | POA: Insufficient documentation

## 2012-01-21 DIAGNOSIS — I10 Essential (primary) hypertension: Secondary | ICD-10-CM | POA: Insufficient documentation

## 2012-01-21 DIAGNOSIS — N400 Enlarged prostate without lower urinary tract symptoms: Secondary | ICD-10-CM | POA: Insufficient documentation

## 2012-01-21 DIAGNOSIS — G20 Parkinson's disease: Secondary | ICD-10-CM | POA: Insufficient documentation

## 2012-01-21 DIAGNOSIS — J4489 Other specified chronic obstructive pulmonary disease: Secondary | ICD-10-CM | POA: Insufficient documentation

## 2012-01-21 DIAGNOSIS — R609 Edema, unspecified: Secondary | ICD-10-CM | POA: Insufficient documentation

## 2012-01-21 NOTE — Progress Notes (Signed)
Long conversation on 01/16/12 with Mrs. Sconyers.   Patient not doing as well, seems to be having more memory problems and also not as steady in his activity, walking, getting off and on equipment.  Advised she contact primary MD and neurologist that is managing parkinson's.  Today she states she has made the calls and has been told the offices would call her back.  We will continue to encourage and support and follow up if she does not hear back.

## 2012-01-21 NOTE — Telephone Encounter (Signed)
Appointment made

## 2012-01-23 ENCOUNTER — Encounter (HOSPITAL_COMMUNITY)
Admission: RE | Admit: 2012-01-23 | Discharge: 2012-01-23 | Disposition: A | Discharge status: Home / Self Care | Payer: Medicare Other | Source: Ambulatory Visit | Attending: Internal Medicine | Admitting: Internal Medicine

## 2012-01-27 ENCOUNTER — Ambulatory Visit: Payer: Medicare Other | Admitting: Family Medicine

## 2012-01-28 ENCOUNTER — Encounter (HOSPITAL_COMMUNITY)
Admission: RE | Admit: 2012-01-28 | Discharge: 2012-01-28 | Disposition: A | Discharge status: Home / Self Care | Payer: Medicare Other | Source: Ambulatory Visit | Attending: Internal Medicine | Admitting: Internal Medicine

## 2012-01-30 ENCOUNTER — Encounter: Payer: Self-pay | Admitting: Family Medicine

## 2012-01-30 ENCOUNTER — Encounter (HOSPITAL_COMMUNITY)
Admission: RE | Admit: 2012-01-30 | Discharge: 2012-01-30 | Disposition: A | Discharge status: Home / Self Care | Payer: Medicare Other | Source: Ambulatory Visit | Attending: Internal Medicine | Admitting: Internal Medicine

## 2012-01-30 ENCOUNTER — Ambulatory Visit (INDEPENDENT_AMBULATORY_CARE_PROVIDER_SITE_OTHER): Payer: Medicare Other | Admitting: Family Medicine

## 2012-01-30 VITALS — BP 130/80 | HR 76 | Temp 98.2°F | Resp 18 | Ht 69.0 in | Wt 200.0 lb

## 2012-01-30 DIAGNOSIS — G20A1 Parkinson's disease without dyskinesia, without mention of fluctuations: Secondary | ICD-10-CM

## 2012-01-30 DIAGNOSIS — J449 Chronic obstructive pulmonary disease, unspecified: Secondary | ICD-10-CM

## 2012-01-30 DIAGNOSIS — J9610 Chronic respiratory failure, unspecified whether with hypoxia or hypercapnia: Secondary | ICD-10-CM

## 2012-01-30 DIAGNOSIS — K219 Gastro-esophageal reflux disease without esophagitis: Secondary | ICD-10-CM

## 2012-01-30 DIAGNOSIS — J4489 Other specified chronic obstructive pulmonary disease: Secondary | ICD-10-CM

## 2012-01-30 DIAGNOSIS — N400 Enlarged prostate without lower urinary tract symptoms: Secondary | ICD-10-CM

## 2012-01-30 DIAGNOSIS — R609 Edema, unspecified: Secondary | ICD-10-CM

## 2012-01-30 DIAGNOSIS — E7800 Pure hypercholesterolemia, unspecified: Secondary | ICD-10-CM

## 2012-01-30 DIAGNOSIS — I10 Essential (primary) hypertension: Secondary | ICD-10-CM

## 2012-01-30 DIAGNOSIS — G20 Parkinson's disease: Secondary | ICD-10-CM

## 2012-01-30 MED ORDER — NEBIVOLOL HCL 5 MG PO TABS: 5.0000 mg | ORAL_TABLET | Freq: Every day | ORAL | Status: DC

## 2012-01-30 MED ORDER — OMEPRAZOLE 20 MG PO CPDR: 20.0000 mg | DELAYED_RELEASE_CAPSULE | Freq: Every day | ORAL | Status: DC

## 2012-01-30 MED ORDER — BUDESONIDE 0.5 MG/2ML IN SUSP: 0.5000 mg | Freq: Two times a day (BID) | RESPIRATORY_TRACT | Status: DC

## 2012-01-30 MED ORDER — TIOTROPIUM BROMIDE MONOHYDRATE 18 MCG IN CAPS: 18.0000 ug | ORAL_CAPSULE | Freq: Every day | RESPIRATORY_TRACT | Status: DC

## 2012-01-30 MED ORDER — TRIAMTERENE-HCTZ 37.5-25 MG PO TABS: 1.0000 | ORAL_TABLET | Freq: Every day | ORAL | Status: DC

## 2012-01-30 MED ORDER — PRAVASTATIN SODIUM 20 MG PO TABS: 20.0000 mg | ORAL_TABLET | Freq: Every day | ORAL | Status: DC

## 2012-01-30 MED ORDER — ARFORMOTEROL TARTRATE 15 MCG/2ML IN NEBU: 15.0000 ug | INHALATION_SOLUTION | Freq: Two times a day (BID) | RESPIRATORY_TRACT | Status: DC

## 2012-01-30 NOTE — Progress Notes (Signed)
  Subjective:    Patient ID: [name], male    DOB: 1927-01-02, 78 y.o.   MRN: [id number]  HPI [name] is an 78 year old married male X. smoker who comes in today for annual evaluation because of a history of COPD, Parkinson's disease, mild hypertension, reflux esophagitis, hyperlipidemia,  He wears 2 L of oxygen night and when he goes to pulmonary rehabilitation. Pulse ox on room air today is 94. His medication reviewed in detail and there've been no changes except she's not taking the potassium nor the Mirapex.    Review of Systems    general review of systems otherwise negative Objective:   Physical Exam Elderly male no acute distress pulse ox 94 and room air cardiopulmonary exam shows symmetrical but markedly diminished breath sounds heart sounds distance because of underlying COPD but pulses regular       Assessment & Plan:  COPD continue current medications  Parkinson's disease continue current medications  Hypertension continue current medication  Reflux esophagitis continue current medication  Hyperlipidemia continue current medication hearing loss referred for audiogram

## 2012-01-30 NOTE — Patient Instructions (Signed)
Continue your current medications  He'll have to call Dr. Anne Hahn for a refill on his Sinemet  Return in one year sooner if any problems  Check a pulse ox weekly if his oxygen level drops below 88 continuously he will need to be on continuous oxygen

## 2012-02-04 ENCOUNTER — Encounter (HOSPITAL_COMMUNITY)
Admission: RE | Admit: 2012-02-04 | Discharge: 2012-02-04 | Disposition: A | Discharge status: Home / Self Care | Payer: Medicare Other | Source: Ambulatory Visit | Attending: Internal Medicine | Admitting: Internal Medicine

## 2012-02-04 NOTE — Progress Notes (Signed)
Pulmonary Rehabilitation Program Outcomes Report   Orientation:  10/16/2011 Graduate Date:  02/04/2012 Discharge Date:  02/04/2012 # of sessions completed: 24  Pulmonologist: Wert Class Time: 10:30am  A.  Exercise Program:  Tolerates exercise @ 2.3 METS for 45 minutes, Walk Test Results:  Pre: 600 ft and Post: 825 ft, Improved functional capacity  37.50 %, Improved  muscular strength  23.81 %, Improved dyspnea score 15.28 %, Improved education score 66.67 %, Exercise limited by dyspnea, Discharged to home exercise program.  Anticipated compliance:  fair and Discharged  B.  Mental Health:  Quality of Life (QOL)  improvements:  Overall  9.09 %, Health/Functioning 0.57 %, Socioeconomics 5.93 %, Psych/Spiritual 14.40 %, Family 26.73 %    C.  Education/Instruction/Skills  Uses Perceived Exertion Scale and/or Dyspnea Scale and Attended 3 education classes  Demonstrates accurate diaphragmatic breathing and pursed lip breathing. Home exercise given: 11/21/2011  D.  Nutrition/Weight Control/Body Composition:  Pt making healthy dietary choices.  Pt wt up 2.2 kg during rehab. BMI 30.2, % body fat 31.7%. Pt with 2.9%  increase in % body fat. Section Completed by: Mickle Plumb, M.Ed, RD, LDN, CDE    E.  Blood Lipids   Lab Results  Component Value Date   CHOL 158 03/26/2011   HDL 45.70 03/26/2011   LDLCALC 89 03/26/2011   TRIG 118.0 03/26/2011   CHOLHDL 3 03/26/2011   F.  Lifestyle Changes:  Making positive lifestyle changes and Needs physician encouragement on making lifestyle changes  G.  Symptoms noted with exercise:  Shortness of breath and Fatigue  Report Completed By:  Patient has completed the undergrad pulmonary rehab program. Patient achieved all personal and program goals. Patient did well with the program. Slow and steady with all equipment. Patient ambulated further during the post 6 minute walk test and maintained sats above 90% on 2L NCC. Patient was very  satisfied with the outcomes. Mr. Tamera Reason plans to continue walking at home for his continuance of exercise. Patient really needs encouragement with his PLB and using his oxygen with exertion and while ambulating. As you can see above patient improved on all during his time with [place] at pulmonary rehab. Very compliant and great to work with.  Courtney L. Allred, MS, NASM, CES  Agree with the above note.   Cathie Olden RN

## 2012-02-17 DIAGNOSIS — I6789 Other cerebrovascular disease: Secondary | ICD-10-CM | POA: Insufficient documentation

## 2012-02-17 DIAGNOSIS — R259 Unspecified abnormal involuntary movements: Secondary | ICD-10-CM | POA: Insufficient documentation

## 2012-02-17 DIAGNOSIS — R413 Other amnesia: Secondary | ICD-10-CM | POA: Insufficient documentation

## 2012-02-17 DIAGNOSIS — I6350 Cerebral infarction due to unspecified occlusion or stenosis of unspecified cerebral artery: Secondary | ICD-10-CM | POA: Insufficient documentation

## 2012-04-08 ENCOUNTER — Other Ambulatory Visit: Payer: Self-pay | Admitting: Family Medicine

## 2012-06-08 ENCOUNTER — Encounter: Payer: Medicare Other | Admitting: Family Medicine

## 2012-06-29 ENCOUNTER — Encounter: Payer: Medicare Other | Admitting: Family Medicine

## 2012-07-03 ENCOUNTER — Other Ambulatory Visit: Payer: Self-pay | Admitting: Family Medicine

## 2012-07-03 ENCOUNTER — Other Ambulatory Visit: Payer: Self-pay | Admitting: Adult Health

## 2012-08-05 ENCOUNTER — Telehealth: Payer: Self-pay | Admitting: Internal Medicine

## 2012-08-05 NOTE — Telephone Encounter (Signed)
Nothing above the mucinex dm 1200 mg twice daily s ov - if gets worse to ER

## 2012-08-05 NOTE — Telephone Encounter (Signed)
Spoke with patients spouse, informed her of recs per Dr. [name] as listed below. Verbalized understanding and nothing further needed at this time.

## 2012-08-05 NOTE — Telephone Encounter (Signed)
Called spoke with patient's spouse Reports has been having increased prod cough with gray mucus x2 days Denies wheezing, tightness in chest, increased SOB, f/c/s Has been treating with nebs, mucinex dm, delsym otc Wants ov today > no openings and TP is not in the office today or tomorrow OV scheduled with MW tomorrow morning at 10am Pt and wife wanting additional recs to help with cough until ov MW please advise, thanks.  NKDA - verified [facility]

## 2012-08-06 ENCOUNTER — Ambulatory Visit: Payer: Medicare Other | Admitting: Internal Medicine

## 2012-08-11 ENCOUNTER — Ambulatory Visit: Payer: Medicare Other | Admitting: Internal Medicine

## 2012-08-18 ENCOUNTER — Ambulatory Visit (INDEPENDENT_AMBULATORY_CARE_PROVIDER_SITE_OTHER): Payer: Medicare Other | Admitting: Family Medicine

## 2012-08-18 ENCOUNTER — Encounter: Payer: Self-pay | Admitting: Family Medicine

## 2012-08-18 VITALS — BP 120/70 | HR 60 | Temp 97.6°F | Ht 69.0 in | Wt 190.0 lb

## 2012-08-18 DIAGNOSIS — N400 Enlarged prostate without lower urinary tract symptoms: Secondary | ICD-10-CM

## 2012-08-18 DIAGNOSIS — K219 Gastro-esophageal reflux disease without esophagitis: Secondary | ICD-10-CM

## 2012-08-18 DIAGNOSIS — E7800 Pure hypercholesterolemia, unspecified: Secondary | ICD-10-CM

## 2012-08-18 DIAGNOSIS — I10 Essential (primary) hypertension: Secondary | ICD-10-CM

## 2012-08-18 DIAGNOSIS — J449 Chronic obstructive pulmonary disease, unspecified: Secondary | ICD-10-CM

## 2012-08-18 LAB — CBC WITH DIFFERENTIAL/PLATELET
Basophils Relative: 0.6 % (ref 0.0–3.0)
Eosinophils Relative: 0.3 % (ref 0.0–5.0)
HCT: 45.3 % (ref 39.0–52.0)
Hemoglobin: 16.1 g/dL (ref 13.0–17.0)
Lymphocytes Relative: 11.5 % — ABNORMAL LOW (ref 12.0–46.0)
Lymphs Abs: 1.1 10*3/uL (ref 0.7–4.0)
Monocytes Relative: 4.7 % (ref 3.0–12.0)
Neutro Abs: 7.9 10*3/uL — ABNORMAL HIGH (ref 1.4–7.7)
RBC: 4.75 Mil/uL (ref 4.22–5.81)
RDW: 13.9 % (ref 11.5–14.6)
WBC: 9.5 10*3/uL (ref 4.5–10.5)

## 2012-08-18 LAB — POCT URINALYSIS DIPSTICK
Blood, UA: NEGATIVE
Glucose, UA: NEGATIVE
pH, UA: 6

## 2012-08-18 LAB — BASIC METABOLIC PANEL
BUN: 22 mg/dL (ref 6–23)
Chloride: 99 mEq/L (ref 96–112)
Glucose, Bld: 100 mg/dL — ABNORMAL HIGH (ref 70–99)
Potassium: 3.9 mEq/L (ref 3.5–5.1)
Sodium: 138 mEq/L (ref 135–145)

## 2012-08-18 MED ORDER — ARFORMOTEROL TARTRATE 15 MCG/2ML IN NEBU: INHALATION_SOLUTION | RESPIRATORY_TRACT | Status: DC

## 2012-08-18 MED ORDER — ALBUTEROL SULFATE HFA 108 (90 BASE) MCG/ACT IN AERS: 2.0000 | INHALATION_SPRAY | RESPIRATORY_TRACT | Status: DC | PRN

## 2012-08-18 MED ORDER — OMEPRAZOLE 20 MG PO CPDR: 20.0000 mg | DELAYED_RELEASE_CAPSULE | Freq: Every day | ORAL | Status: DC

## 2012-08-18 MED ORDER — TRIAMTERENE-HCTZ 37.5-25 MG PO TABS: ORAL_TABLET | ORAL | Status: DC

## 2012-08-18 MED ORDER — TIOTROPIUM BROMIDE MONOHYDRATE 18 MCG IN CAPS: 18.0000 ug | ORAL_CAPSULE | Freq: Every day | RESPIRATORY_TRACT | Status: DC

## 2012-08-18 MED ORDER — BUDESONIDE 0.5 MG/2ML IN SUSP: RESPIRATORY_TRACT | Status: DC

## 2012-08-18 NOTE — Progress Notes (Signed)
  Subjective:    Patient ID: [name], male    DOB: Apr 03, 1927, 79 y.o.   MRN: [id number]  HPI [name] is an 79 year old male who comes in today accompanied by his wife for general Medicare wellness examination  He has a history of underlying COPD and is currently on albuterol 2 puffs 4 times a day when necessary, nebulizer, Pulmicort, Spiriva, he says his pulmonary status is stable  He sees the folks in neurology because of Parkinson's disease. He's currently on Sinemet 1 tab 4 times daily  He also has reflux esophagitis and takes Prilosec 20 mg daily  He has hyperlipidemia and takes Pravachol 20 mg daily  He also takes Maxzide for fluid retention and mild hypertension along with a beta blocker 5 mg daily BP 120/70 which is too low.  His wife [name] says that he is having difficulty with his memory sometimes confused. He walks on a regular basis with his wife.  Cognitive function is diminishing slowly home health safety reviewed no issues identified, no guns in the house, he does have a health care power of attorney and living well   Review of Systems  Constitutional: Negative.   HENT: Negative.   Eyes: Negative.   Respiratory: Negative.   Cardiovascular: Negative.   Gastrointestinal: Negative.   Genitourinary: Negative.   Musculoskeletal: Negative.   Skin: Negative.   Neurological: Negative.   Psychiatric/Behavioral: Negative.        Objective:   Physical Exam  Constitutional: He is oriented to person, place, and time. He appears well-developed and well-nourished.  HENT:  Head: Normocephalic and atraumatic.  Right Ear: External ear normal.  Left Ear: External ear normal.  Nose: Nose normal.  Mouth/Throat: Oropharynx is clear and moist.  Eyes: Conjunctivae and EOM are normal. Pupils are equal, round, and reactive to light.  Neck: Normal range of motion. Neck supple. No JVD present. No tracheal deviation present. No thyromegaly present.  Cardiovascular: Normal rate,  regular rhythm and intact distal pulses.  Exam reveals no gallop and no friction rub.   No murmur heard. Heart sounds distant no carotid or aortic bruits peripheral pulses symmetrical and 1+  Pulmonary/Chest: Effort normal. No stridor. No respiratory distress. He has no wheezes. He has no rales. He exhibits no tenderness.  Respiratory rate normal 12 regular  No JVD  Breath sounds extremely distant no wheezing  Abdominal: Soft. Bowel sounds are normal. He exhibits no distension and no mass. There is no tenderness. There is no rebound and no guarding.  Musculoskeletal: Normal range of motion. He exhibits no edema and no tenderness.  Lymphadenopathy:    He has no cervical adenopathy.  Neurological: He is alert and oriented to person, place, and time. He has normal reflexes. No cranial nerve deficit. He exhibits normal muscle tone.  Skin: Skin is warm and dry. No rash noted. No erythema. No pallor.  Psychiatric: He has a normal mood and affect. His behavior is normal. Judgment and thought content normal.          Assessment & Plan:  Healthy male  COPD stable continue current medications  Parkinson's disease slowly deteriorating however continue current medication  Hypertension continue Maxzide stop the by systolic followup in one month  Reflux esophagitis continue Prilosec  Hyperlipidemia continue Pravachol

## 2012-08-18 NOTE — Patient Instructions (Addendum)
Stop  the bystolic and the Pravachol  Continue his other medications  Followup in 4 weeks

## 2012-08-20 NOTE — Progress Notes (Signed)
Quick Note:  Pt informed ______ 

## 2012-09-17 ENCOUNTER — Encounter: Payer: Self-pay | Admitting: Family Medicine

## 2012-09-17 ENCOUNTER — Ambulatory Visit (INDEPENDENT_AMBULATORY_CARE_PROVIDER_SITE_OTHER): Payer: Medicare Other | Admitting: Family Medicine

## 2012-09-17 VITALS — BP 102/64 | Temp 97.5°F | Wt 184.0 lb

## 2012-09-17 DIAGNOSIS — I10 Essential (primary) hypertension: Secondary | ICD-10-CM

## 2012-09-17 NOTE — Progress Notes (Signed)
  Subjective:    Patient ID: [name], male    DOB: [birth year], 79 y.o.   MRN: [id number]  HPI [name] is an 79 year old male married ex-smoker who comes in today for evaluation of hypotension and dark urine  His blood pressure today is 102/64. He's on Maxzide 1 tablet daily for hypertension.  He's also complaining of dark urine. He says he drinks about 4 glasses of water a day. No urinary tract symptoms  He takes Sinemet for Parkinson's disease. His wife [name] says he sees to be doing well but he sleeps a lot. He's also is beginning to have a lot of trouble with his memory   Review of Systems    review of systems otherwise negative Objective:   Physical Exam Well-developed well-nourished male no acute distress urinalysis,,,,,, no hematuria       Assessment & Plan:  Parkinson's disease continue current medications  With his history of Parkinson's and dementia would recommend we stopped the Pravachol  Hypertension.

## 2012-09-17 NOTE — Patient Instructions (Addendum)
Stop the Maxzide and Pravachol  Drink 38 ounce glasses of water daily  Return when necessary

## 2012-09-28 ENCOUNTER — Ambulatory Visit: Payer: Self-pay | Admitting: Neurology

## 2012-10-01 ENCOUNTER — Encounter: Payer: Self-pay | Admitting: Neurology

## 2012-10-01 ENCOUNTER — Ambulatory Visit (INDEPENDENT_AMBULATORY_CARE_PROVIDER_SITE_OTHER): Payer: Medicare Other | Admitting: Neurology

## 2012-10-01 VITALS — BP 105/58 | HR 75 | Ht 69.0 in | Wt 184.0 lb

## 2012-10-01 DIAGNOSIS — I6350 Cerebral infarction due to unspecified occlusion or stenosis of unspecified cerebral artery: Secondary | ICD-10-CM

## 2012-10-01 DIAGNOSIS — G20A1 Parkinson's disease without dyskinesia, without mention of fluctuations: Secondary | ICD-10-CM

## 2012-10-01 DIAGNOSIS — R259 Unspecified abnormal involuntary movements: Secondary | ICD-10-CM

## 2012-10-01 DIAGNOSIS — R413 Other amnesia: Secondary | ICD-10-CM

## 2012-10-01 DIAGNOSIS — G20 Parkinson's disease: Secondary | ICD-10-CM

## 2012-10-01 DIAGNOSIS — I6789 Other cerebrovascular disease: Secondary | ICD-10-CM

## 2012-10-01 MED ORDER — CARBIDOPA-LEVODOPA-ENTACAPONE 37.5-150-200 MG PO TABS: 1.0000 | ORAL_TABLET | Freq: Four times a day (QID) | ORAL | Status: DC

## 2012-10-01 NOTE — Progress Notes (Signed)
Reason for visit: Parkinson's disease  KRISHAV MAMONE is an 79 y.o. male  History of present illness:  Mr. Krantz is an 79 year old left-handed white male with a history of Parkinson's disease. The patient was recently reduced on the total carbidopa dose during the day. The patient has been on Stalevo taking the 200 mg tablets, one tablet 4 times daily. The patient continues to have problems with drowsiness. The patient will nap frequently during the day, up to 4-5 hours. The patient indicates that he sleeps well at night. The patient uses oxygen at night. The patient will occasionally snore, but he does not have much in the way of problems with acting out his dreams. The patient does not report vivid dreams. The patient returns to this office for an evaluation. The patient has not noted any significant changes in his underlying functional level. The patient denies problems with choking or swallowing.  Past Medical History  Diagnosis Date  . GERD (gastroesophageal reflux disease)   . Pure hypercholesterolemia   . Paralysis agitans   . Mixed hyperlipidemia   . Unspecified essential hypertension   . Hypertrophy of prostate without urinary obstruction and other lower urinary tract symptoms (LUTS)   . Obesity, unspecified   . Chronic airway obstruction, not elsewhere classified   . Hypertension   . REM sleep behavior disorder   . Memory disorder     Past Surgical History  Procedure Laterality Date  . Hemorrhoid surgery    . Tonsillectomy      Family History  Problem Relation Age of Onset  . Coronary artery disease    . Heart attack Brother   . Heart attack Mother     Social history:  reports that he quit smoking about 34 years ago. His smoking use included Cigarettes. He has a 60 pack-year smoking history. He has never used smokeless tobacco. He reports that he does not drink alcohol or use illicit drugs.  Allergies:  Allergies  Allergen Reactions  . Amantadines    Nervousness  . Xanax (Alprazolam)     Medications:  Current Outpatient Prescriptions on File Prior to Visit  Medication Sig Dispense Refill  . albuterol (PROVENTIL HFA;VENTOLIN HFA) 108 (90 BASE) MCG/ACT inhaler Inhale 2 puffs into the lungs every 4 (four) hours as needed.  3 Inhaler  3  . arformoterol (BROVANA) 15 MCG/2ML NEBU USE 1 VIAL ( TOTAL ) VIA NEBULIZER TWICE DAILY  180 mL  2  . aspirin 325 MG tablet Take 325 mg by mouth daily.        . budesonide (PULMICORT) 0.5 MG/2ML nebulizer solution USE 1 VIAL ( 0.5MG  TOTAL ) VIA NEBULIZER TWICE DAILY  6 mL  11  . Multiple Vitamin (MULTIVITAMIN) capsule Take 1 capsule by mouth daily.        Marland Kitchen omeprazole (PRILOSEC) 20 MG capsule Take 1 capsule (20 mg total) by mouth daily. 30-60 minutes before first meal of the day  100 capsule  3  . pravastatin (PRAVACHOL) 20 MG tablet       . tiotropium (SPIRIVA HANDIHALER) 18 MCG inhalation capsule Place 1 capsule (18 mcg total) into inhaler and inhale daily.  100 capsule  3   No current facility-administered medications on file prior to visit.    ROS:  Out of a complete 14 system review of symptoms, the patient complains only of the following symptoms, and all other reviewed systems are negative.  Hearing loss Moles Shortness of breath, cough Easy bruising Impotence Memory  loss, confusion Excessive drowsiness, decreased energy  Blood pressure 105/58, pulse 75, height 5\' 9"  (1.753 m), weight 184 lb (83.462 kg).  Physical Exam  General: The patient is alert and cooperative at the time of the examination.  Skin: No significant peripheral edema is noted.   Neurologic Exam  Cranial nerves: Facial symmetry is present. Speech is normal, no aphasia or dysarthria is noted. Extraocular movements are full. Visual fields are full.  Motor: The patient has good strength in all 4 extremities.  Coordination: The patient has good finger-nose-finger and heel-to-shin bilaterally.  Gait and  station: The patient has a normal gait. Tandem gait is normal. Romberg is negative. No drift is seen.  Reflexes: Deep tendon reflexes are symmetric.   Assessment/Plan:  One. Parkinson's disease  2. Excessive daytime drowsiness  The patient will be reduced again on the Stalevo taking the 150 mg tablets 4 times daily. Hopefully, this will help some of the daytime drowsiness that he is having. If a change in his mobility is noted, the family will contact me. The patient will followup through this office in 6 months.  Marlan Palau MD 10/01/2012 4:37 PM  [facility] [address] [place], [place] [address]  Phone [phone] Fax [phone]

## 2012-10-29 ENCOUNTER — Telehealth: Payer: Self-pay | Admitting: Family Medicine

## 2012-10-29 DIAGNOSIS — J9610 Chronic respiratory failure, unspecified whether with hypoxia or hypercapnia: Secondary | ICD-10-CM

## 2012-10-29 DIAGNOSIS — J449 Chronic obstructive pulmonary disease, unspecified: Secondary | ICD-10-CM

## 2012-10-29 NOTE — Telephone Encounter (Signed)
Pt wife called and stated that the pt needs a "replacement nebulizer RX". She states that his nebulizer has quit working and that [facility] told her that she would need another RX from the physician. Please assist.

## 2012-10-29 NOTE — Telephone Encounter (Signed)
New order placed [facility]ed to [facility]

## 2012-11-29 ENCOUNTER — Other Ambulatory Visit: Payer: Self-pay | Admitting: Family Medicine

## 2012-12-08 ENCOUNTER — Other Ambulatory Visit: Payer: Self-pay | Admitting: Adult Health

## 2012-12-08 ENCOUNTER — Other Ambulatory Visit: Payer: Self-pay | Admitting: Family Medicine

## 2012-12-11 ENCOUNTER — Telehealth: Payer: Self-pay | Admitting: Internal Medicine

## 2012-12-11 DIAGNOSIS — J449 Chronic obstructive pulmonary disease, unspecified: Secondary | ICD-10-CM

## 2012-12-11 MED ORDER — ALBUTEROL SULFATE HFA 108 (90 BASE) MCG/ACT IN AERS: 2.0000 | INHALATION_SPRAY | RESPIRATORY_TRACT | Status: AC | PRN

## 2012-12-11 NOTE — Telephone Encounter (Signed)
Pt is overdue appointment; gave verbal order for 1 time only refill of albuterol HFA and patient to make/keep Ov with MW.

## 2013-01-11 ENCOUNTER — Telehealth: Payer: Self-pay | Admitting: Family Medicine

## 2013-01-11 DIAGNOSIS — J449 Chronic obstructive pulmonary disease, unspecified: Secondary | ICD-10-CM

## 2013-01-11 MED ORDER — ARFORMOTEROL TARTRATE 15 MCG/2ML IN NEBU: INHALATION_SOLUTION | RESPIRATORY_TRACT | Status: DC

## 2013-01-11 NOTE — Telephone Encounter (Signed)
Pharmacy received e-script for BROVANA 15 MCG/2ML NEBU.  However, since it will be filed to Medicare Part B, the script has to include the ordering physician's signature.  Please fax script that includes physician's signature.

## 2013-01-11 NOTE — Telephone Encounter (Signed)
[facility]

## 2013-01-31 ENCOUNTER — Emergency Department (HOSPITAL_COMMUNITY): Payer: Medicare Other

## 2013-01-31 ENCOUNTER — Telehealth: Payer: Self-pay | Admitting: Family Medicine

## 2013-01-31 ENCOUNTER — Encounter (HOSPITAL_COMMUNITY): Payer: Self-pay | Admitting: Emergency Medicine

## 2013-01-31 ENCOUNTER — Inpatient Hospital Stay (HOSPITAL_COMMUNITY)
Admission: EM | Admit: 2013-01-31 | Discharge: 2013-02-04 | DRG: 189 | Disposition: A | Discharge status: Skilled Nursing Facility | Payer: Medicare Other | Attending: Internal Medicine | Admitting: Internal Medicine

## 2013-01-31 DIAGNOSIS — N138 Other obstructive and reflux uropathy: Secondary | ICD-10-CM | POA: Diagnosis present

## 2013-01-31 DIAGNOSIS — G2581 Restless legs syndrome: Secondary | ICD-10-CM

## 2013-01-31 DIAGNOSIS — K219 Gastro-esophageal reflux disease without esophagitis: Secondary | ICD-10-CM | POA: Diagnosis present

## 2013-01-31 DIAGNOSIS — R0902 Hypoxemia: Secondary | ICD-10-CM

## 2013-01-31 DIAGNOSIS — N400 Enlarged prostate without lower urinary tract symptoms: Secondary | ICD-10-CM | POA: Diagnosis present

## 2013-01-31 DIAGNOSIS — I10 Essential (primary) hypertension: Secondary | ICD-10-CM | POA: Diagnosis present

## 2013-01-31 DIAGNOSIS — R339 Retention of urine, unspecified: Secondary | ICD-10-CM | POA: Diagnosis present

## 2013-01-31 DIAGNOSIS — Z79899 Other long term (current) drug therapy: Secondary | ICD-10-CM

## 2013-01-31 DIAGNOSIS — N179 Acute kidney failure, unspecified: Secondary | ICD-10-CM

## 2013-01-31 DIAGNOSIS — Z66 Do not resuscitate: Secondary | ICD-10-CM | POA: Diagnosis present

## 2013-01-31 DIAGNOSIS — N401 Enlarged prostate with lower urinary tract symptoms: Secondary | ICD-10-CM | POA: Diagnosis present

## 2013-01-31 DIAGNOSIS — E782 Mixed hyperlipidemia: Secondary | ICD-10-CM | POA: Diagnosis present

## 2013-01-31 DIAGNOSIS — G20A1 Parkinson's disease without dyskinesia, without mention of fluctuations: Secondary | ICD-10-CM | POA: Diagnosis present

## 2013-01-31 DIAGNOSIS — E7800 Pure hypercholesterolemia, unspecified: Secondary | ICD-10-CM

## 2013-01-31 DIAGNOSIS — E8779 Other fluid overload: Secondary | ICD-10-CM | POA: Diagnosis present

## 2013-01-31 DIAGNOSIS — J9620 Acute and chronic respiratory failure, unspecified whether with hypoxia or hypercapnia: Principal | ICD-10-CM | POA: Diagnosis present

## 2013-01-31 DIAGNOSIS — Z87891 Personal history of nicotine dependence: Secondary | ICD-10-CM

## 2013-01-31 DIAGNOSIS — G20 Parkinson's disease: Secondary | ICD-10-CM | POA: Diagnosis present

## 2013-01-31 DIAGNOSIS — J441 Chronic obstructive pulmonary disease with (acute) exacerbation: Secondary | ICD-10-CM | POA: Diagnosis present

## 2013-01-31 DIAGNOSIS — K5900 Constipation, unspecified: Secondary | ICD-10-CM | POA: Diagnosis present

## 2013-01-31 DIAGNOSIS — R413 Other amnesia: Secondary | ICD-10-CM

## 2013-01-31 DIAGNOSIS — K5641 Fecal impaction: Secondary | ICD-10-CM | POA: Diagnosis present

## 2013-01-31 DIAGNOSIS — R000 Tachycardia, unspecified: Secondary | ICD-10-CM | POA: Diagnosis present

## 2013-01-31 DIAGNOSIS — J449 Chronic obstructive pulmonary disease, unspecified: Secondary | ICD-10-CM

## 2013-01-31 DIAGNOSIS — Z9981 Dependence on supplemental oxygen: Secondary | ICD-10-CM

## 2013-01-31 DIAGNOSIS — Z7982 Long term (current) use of aspirin: Secondary | ICD-10-CM

## 2013-01-31 HISTORY — DX: Parkinson's disease: G20

## 2013-01-31 HISTORY — DX: Parkinson's disease without dyskinesia, without mention of fluctuations: G20.A1

## 2013-01-31 LAB — COMPREHENSIVE METABOLIC PANEL
ALT: 8 U/L (ref 0–53)
BUN: 34 mg/dL — ABNORMAL HIGH (ref 6–23)
CO2: 24 mEq/L (ref 19–32)
Calcium: 9.3 mg/dL (ref 8.4–10.5)
Creatinine, Ser: 1.66 mg/dL — ABNORMAL HIGH (ref 0.50–1.35)
GFR calc Af Amer: 42 mL/min — ABNORMAL LOW (ref >90)
GFR calc non Af Amer: 37 mL/min — ABNORMAL LOW (ref >90)
Glucose, Bld: 150 mg/dL — ABNORMAL HIGH (ref 70–99)
Sodium: 139 mEq/L (ref 135–145)
Total Bilirubin: 1.4 mg/dL — ABNORMAL HIGH (ref 0.3–1.2)
Total Protein: 6.7 g/dL (ref 6.0–8.3)

## 2013-01-31 LAB — CBC WITH DIFFERENTIAL/PLATELET
Eosinophils Relative: 0 % (ref 0–5)
HCT: 42.9 % (ref 39.0–52.0)
Hemoglobin: 15.2 g/dL (ref 13.0–17.0)
Lymphocytes Relative: 3 % — ABNORMAL LOW (ref 12–46)
Lymphs Abs: 0.4 10*3/uL — ABNORMAL LOW (ref 0.7–4.0)
MCV: 94.7 fL (ref 78.0–100.0)
Monocytes Absolute: 0.5 10*3/uL (ref 0.1–1.0)
Monocytes Relative: 3 % (ref 3–12)
RBC: 4.53 MIL/uL (ref 4.22–5.81)
WBC: 15 10*3/uL — ABNORMAL HIGH (ref 4.0–10.5)

## 2013-01-31 LAB — URINALYSIS, ROUTINE W REFLEX MICROSCOPIC
Bilirubin Urine: NEGATIVE
Nitrite: NEGATIVE
Protein, ur: NEGATIVE mg/dL
Specific Gravity, Urine: 1.016 (ref 1.005–1.030)
Urobilinogen, UA: 0.2 mg/dL (ref 0.0–1.0)

## 2013-01-31 LAB — POCT I-STAT 3, ART BLOOD GAS (G3+)
Acid-Base Excess: 1 mmol/L (ref 0.0–2.0)
O2 Saturation: 97 %
Patient temperature: 98.6
TCO2: 28 mmol/L (ref 0–100)
pCO2 arterial: 46.2 mmHg — ABNORMAL HIGH (ref 35.0–45.0)

## 2013-01-31 LAB — CG4 I-STAT (LACTIC ACID): Lactic Acid, Venous: 1.48 mmol/L (ref 0.5–2.2)

## 2013-01-31 LAB — URINE MICROSCOPIC-ADD ON

## 2013-01-31 MED ORDER — IPRATROPIUM BROMIDE 0.02 % IN SOLN
0.5000 mg | RESPIRATORY_TRACT | Status: DC
  Administered 2013-01-31 – 2013-02-01 (×2): 0.5 mg via RESPIRATORY_TRACT
  Filled 2013-01-31 (×2): qty 2.5

## 2013-01-31 MED ORDER — DEXTROSE 5 % IV SOLN
1.0000 g | Freq: Once | INTRAVENOUS | Status: AC
  Administered 2013-01-31: 1 g via INTRAVENOUS
  Filled 2013-01-31: qty 10

## 2013-01-31 MED ORDER — ALBUTEROL (5 MG/ML) CONTINUOUS INHALATION SOLN
15.0000 mg/h | INHALATION_SOLUTION | Freq: Once | RESPIRATORY_TRACT | Status: AC
  Administered 2013-01-31: 15 mg/h via RESPIRATORY_TRACT
  Filled 2013-01-31: qty 20

## 2013-01-31 MED ORDER — SORBITOL 70 % SOLN
960.0000 mL | TOPICAL_OIL | Freq: Once | ORAL | Status: AC
  Administered 2013-01-31: 960 mL via RECTAL
  Filled 2013-01-31: qty 240

## 2013-01-31 MED ORDER — AZITHROMYCIN 250 MG PO TABS
500.0000 mg | ORAL_TABLET | Freq: Every day | ORAL | Status: AC
  Administered 2013-01-31: 500 mg via ORAL
  Filled 2013-01-31: qty 2

## 2013-01-31 MED ORDER — ALBUTEROL SULFATE (5 MG/ML) 0.5% IN NEBU
2.5000 mg | INHALATION_SOLUTION | RESPIRATORY_TRACT | Status: DC
  Administered 2013-01-31 – 2013-02-01 (×2): 2.5 mg via RESPIRATORY_TRACT
  Filled 2013-01-31 (×2): qty 0.5

## 2013-01-31 NOTE — H&P (Signed)
PCP:  Evette Georges, MD  [facility]:   Chief Complaint:   Abdominal Pain  HPI: [name] is a 79 y.o. male   has a past medical history of GERD (gastroesophageal reflux disease); Pure hypercholesterolemia; Paralysis agitans; Mixed hyperlipidemia; Unspecified essential hypertension; Hypertrophy of prostate without urinary obstruction and other lower urinary tract symptoms (LUTS); Obesity, unspecified; Chronic airway obstruction, not elsewhere classified; Hypertension; REM sleep behavior disorder; and Memory disorder.   Presented with  Patient has hx of severe COPD followed by Dr. [name]. He presented today with 24 hour hx of abdominal pain and shortness of breath. In ER he was found to have distended abdomen. Family states no BM >1 week. ER MD attempted disimpaction but stool was too far up. Enema was ordered. Also patient has severely distended bladder and foley was placed producing 1400 cc of urine.  Currently abdominal pain has resolved. Patient initially was in respiratory distress with tachypnea and tachycardia now improved after getting nebulizer treatments but not back to baseline. He is at baseline on 2 L of O2 which he is on now. He remains tachypneic but states he is much improved. Denies any fever,or cough. Family states breathing got worse after he developed abdominal pain. But he has had progressive worsening of shortness of breath for some time.   Review of Systems:    Pertinent positives include:shortness of breath at rest. dyspnea on exertion, severe constipation and decreased urine output, straining to urinate.   Constitutional:  No weight loss, night sweats, Fevers, chills, fatigue, weight loss  HEENT:  No headaches, Difficulty swallowing,Tooth/dental problems, Sore throat,  No sneezing, itching, ear ache, nasal congestion, post nasal drip,  Cardio-vascular:  No chest pain, Orthopnea, PND, anasarca, dizziness, palpitations.no Bilateral lower extremity  swelling  GI:  No heartburn, indigestion, abdominal pain, nausea, vomiting, diarrhea, change in bowel habits, loss of appetite, melena, blood in stool, hematemesis Resp:   No excess mucus, no productive cough, No non-productive cough, No coughing up of blood.No change in color of mucus.No wheezing. Skin:  no rash or lesions. No jaundice GU:  no dysuria, change in color of urine, no urgency or frequency.   No flank pain.  Musculoskeletal:  No joint pain or no joint swelling. No decreased range of motion. No back pain.  Psych:  No change in mood or affect. No depression or anxiety. No memory loss.  Neuro: no localizing neurological complaints, no tingling, no weakness, no double vision, no gait abnormality, no slurred speech, no confusion  Otherwise ROS are negative except for above, 10 systems were reviewed  Past Medical History: Past Medical History  Diagnosis Date  . GERD (gastroesophageal reflux disease)   . Pure hypercholesterolemia   . Paralysis agitans   . Mixed hyperlipidemia   . Unspecified essential hypertension   . Hypertrophy of prostate without urinary obstruction and other lower urinary tract symptoms (LUTS)   . Obesity, unspecified   . Chronic airway obstruction, not elsewhere classified   . Hypertension   . REM sleep behavior disorder   . Memory disorder    Past Surgical History  Procedure Laterality Date  . Hemorrhoid surgery    . Tonsillectomy       Medications: Prior to Admission medications   Medication Sig Start Date End Date Taking? Authorizing Provider  arformoterol (BROVANA) 15 MCG/2ML NEBU Take 15 mcg by nebulization 2 (two) times daily.   Yes Historical Provider, MD  aspirin 325 MG tablet Take 325 mg by mouth daily.  Yes Historical Provider, MD  budesonide (PULMICORT) 0.5 MG/2ML nebulizer solution Take 0.5 mg by nebulization 2 (two) times daily.   Yes Historical Provider, MD  carbidopa-levodopa-entacapone (STALEVO) 37.5-150-200 MG per tablet  Take 1 tablet by mouth 4 (four) times daily.   Yes Historical Provider, MD  Multiple Vitamin (MULTIVITAMIN) capsule Take 1 capsule by mouth daily.     Yes Historical Provider, MD  nebivolol (BYSTOLIC) 5 MG tablet Take 2.5 mg by mouth daily.    Yes Historical Provider, MD  omeprazole (PRILOSEC) 20 MG capsule Take 1 capsule (20 mg total) by mouth daily. 30-60 minutes before first meal of the day 08/18/12  Yes [name], MD  tiotropium (SPIRIVA HANDIHALER) 18 MCG inhalation capsule Place 1 capsule (18 mcg total) into inhaler and inhale daily. 08/18/12  Yes [name], MD  albuterol (PROVENTIL HFA;VENTOLIN HFA) 108 (90 BASE) MCG/ACT inhaler Inhale 2 puffs into the lungs every 4 (four) hours as needed. 12/11/12   [name], MD    Allergies:  No Known Allergies  Social History:  Ambulatory independently  Lives at home lives with family   reports that he quit smoking about 34 years ago. His smoking use included Cigarettes. He has a 60 pack-year smoking history. He has never used smokeless tobacco. He reports that he does not drink alcohol or use illicit drugs.   Family History: family history includes Coronary artery disease in an other family member; Heart attack in his brother and mother.    Physical Exam: Patient Vitals for the past 24 hrs:  BP Temp Temp src Pulse Resp SpO2  01/31/13 2206 - - - - - 94 %  01/31/13 1915 150/75 mmHg - - 103 30 87 %  01/31/13 1900 142/72 mmHg - - 96 33 87 %  01/31/13 1845 154/66 mmHg - - 94 24 89 %  01/31/13 1830 141/72 mmHg - - 91 25 95 %  01/31/13 1827 145/69 mmHg - - 92 28 95 %  01/31/13 1818 - - - - - 96 %  01/31/13 1815 145/69 mmHg - - 95 32 96 %  01/31/13 1800 151/77 mmHg - - 97 24 94 %  01/31/13 1745 137/82 mmHg - - 100 30 96 %  01/31/13 1734 146/66 mmHg 98.5 F (36.9 C) Oral 100 26 97 %  01/31/13 1729 - - - - - 96 %    1. General:  in No Acute distress 2. Psychological: Alert but slightly confused  3. Head/ENT:   Dry Mucous  Membranes                          Head Non traumatic, neck supple                          Normal   Dentition 4. SKIN:  decreased Skin turgor,  Skin clean Dry and intact no rash 5. Heart: Regular rate and rhythm no Murmur, Rub or gallop 6. Lungs:  Occasional wheezes no  crackles  diminished air movement throughout 7. Abdomen: Soft, non-tender, Non distended 8. Lower extremities: no clubbing, cyanosis, or edema 9. Neurologically Grossly intact, moving all 4 extremities equally tremor present 10. MSK: Normal range of motion  body mass index is unknown because there is no weight on file.   Labs on Admission:   Recent Labs  01/31/13 1900  NA 139  K 3.7  CL 103  CO2 24  GLUCOSE  150*  BUN 34*  CREATININE 1.66*  CALCIUM 9.3    Recent Labs  01/31/13 1900  AST 39*  ALT 8  ALKPHOS 69  BILITOT 1.4*  PROT 6.7  ALBUMIN 3.5    Recent Labs  01/31/13 1900  LIPASE 8*    Recent Labs  01/31/13 1900  WBC 15.0*  NEUTROABS 14.1*  HGB 15.2  HCT 42.9  MCV 94.7  PLT 127*   No results found for this basename: CKTOTAL, CKMB, CKMBINDEX, TROPONINI,  in the last 72 hours No results found for this basename: TSH, T4TOTAL, FREET3, T3FREE, THYROIDAB,  in the last 72 hours No results found for this basename: VITAMINB12, FOLATE, FERRITIN, TIBC, IRON, RETICCTPCT,  in the last 72 hours No results found for this basename: HGBA1C    The CrCl is unknown because both a height and weight (above a minimum accepted value) are required for this calculation. ABG    Component Value Date/Time   PHART 7.375 01/31/2013 1835   HCO3 27.0* 01/31/2013 1835   TCO2 28 01/31/2013 1835   O2SAT 97.0 01/31/2013 1835     No results found for this basename: DDIMER     Other results:  I have pearsonaly reviewed this: ECG REPORT  Rate:100  Rhythm: NSR ST&T Change: non specific changes  UA no evidence of UTI   Cultures: No results found for this basename: sdes, specrequest, cult, reptstatus        Radiological Exams on Admission: Dg Chest Port 1 View  01/31/2013   CLINICAL DATA:  Generalized weakness.  EXAM: PORTABLE CHEST - 1 VIEW  COMPARISON:  PA and lateral chest 11/22/2010.  FINDINGS: The lungs are emphysematous but clear. Heart size is normal. No pneumothorax or pleural fluid. Degenerative change about the left shoulder noted.  IMPRESSION: Emphysema without acute disease.   Electronically Signed   By: Drusilla Kanner M.D.   On: 01/31/2013 20:09   Dg Abd Portable 2v  01/31/2013   CLINICAL DATA:  Abdominal distention and constipation for 7 days.  EXAM: PORTABLE ABDOMEN - 2 VIEW  COMPARISON:  None.  FINDINGS: There is no free intraperitoneal air or evidence of bowel obstruction. The patient has a very large volume of stool throughout the colon including a large stool ball in the rectum. No abnormal abdominal calcification is seen. No focal bony abnormality.  IMPRESSION: No acute finding.  Constipation with a large stool ball in the rectum compatible with fecal impaction.   Electronically Signed   By: Drusilla Kanner M.D.   On: 01/31/2013 20:10    Chart has been reviewed  Assessment/Plan  79 year old gentleman who presents with worsening shortness of breath likely due to COPD exacerbation as well as abdominal pain likely due to combination of stool impaction/obstipation and urinary retention due to bladder outlet obstruction due to a history of BPH  Present on Admission:  . COPD exacerbation -  - Will initiate Solu-Medrol taper, start on Levaquin, Albuterol PRN, scheduled Atrovent,  and Mucinex. Titrate O2 to saturation >90%. Follow patients respiratory status. Watch and step down given tachypnea if no improvement would recommend pulmonology consult in a.m. or sooner if patient decompensates  . Obstipation aggressive bowel regimen repeat KUB in the morning  . HYPERTENSION - continue home medication  . Urinary retention - at bedtime history of BPH will initiate Flomax, she  probably benefit from urology consult for now continue Foley catheter  . PARKINSON'S DISEASE - continue home medication Respiratory distress acute on chronic - most likely secondary  to COPD exacerbation as well as abdominal distention on improving continue to monitor and step down  Prophylaxis:  Lovenox, Protonix  CODE STATUS: FULL CODE as per patient  Other plan as per orders.  I have spent a total of 55 min on this admission  Khamiyah Grefe 01/31/2013, 10:20 PM

## 2013-01-31 NOTE — ED Notes (Signed)
Pt has multiple complaints. Pt presents to ED with c/o generalized weakness, rt abd pain with distention, decreased urine and bowel output x 7 days, loss of appetite x 7 days, and sob/wheezing. Pt was given Neb tx 5mg  Albuterol and 125 solu medrol en route wheezing has resolved. Per family pt has been sleeping more than usual.

## 2013-01-31 NOTE — ED Provider Notes (Signed)
TIME SEEN: 5:50 PM  CHIEF COMPLAINT: Shortness of breath and wheezing, abdominal pain and distention, decreased urine output, constipation  HPI: Patient is a 79 y.o. male with a history of COPD, hyperlipidemia, hypertension, BPH who presents to the emergency department with multiple complaints. He reports that he has had diffuse abdominal pain, worse in the right lower quadrant, that is worse in the right lower quadrant, but isn't achy and sharp pain without radiation that is moderate in nature without aggravating or alleviating factors for the past week. He also reports that he has had difficulty with having a bowel movement or urinating. His last bowel movement was a week ago. He has been passing gas. He states he has not been able to urinate at all today. Denies dysuria or hematuria. No bloody stools or melena. No nausea or vomiting. He also reports that he has been short of breath and wheezing for the past 2 days. This gives better with nebulizer treatments. He received 5 mg of albuterol by EMS and 125 mg of Solu-Medrol. He denies any chest pain. He has had a dry, nonproductive cough. No fever. He states he is chronically on oxygen at home during 2 L. Denies a prior history of intubation.  PCP is Dr. Tawanna Cooler at [place]  ROS: See HPI Constitutional: no fever  Eyes: no drainage  ENT: no runny nose   Cardiovascular:  no chest pain  Resp: SOB  GI: no vomiting GU: no dysuria Integumentary: no rash  Allergy: no hives  Musculoskeletal: no leg swelling  Neurological: no slurred speech ROS otherwise negative  PAST MEDICAL HISTORY/PAST SURGICAL HISTORY:  Past Medical History  Diagnosis Date  . GERD (gastroesophageal reflux disease)   . Pure hypercholesterolemia   . Paralysis agitans   . Mixed hyperlipidemia   . Unspecified essential hypertension   . Hypertrophy of prostate without urinary obstruction and other lower urinary tract symptoms (LUTS)   . Obesity, unspecified   . Chronic airway  obstruction, not elsewhere classified   . Hypertension   . REM sleep behavior disorder   . Memory disorder     MEDICATIONS:  Prior to Admission medications   Medication Sig Start Date End Date Taking? Authorizing Provider  nebivolol (BYSTOLIC) 5 MG tablet Take 5 mg by mouth daily.   Yes Historical Provider, MD  albuterol (PROVENTIL HFA;VENTOLIN HFA) 108 (90 BASE) MCG/ACT inhaler Inhale 2 puffs into the lungs every 4 (four) hours as needed. 12/11/12   [name], MD  arformoterol [place]) 15 MCG/2ML NEBU USE 1 VIAL ( TOTAL ) VIA NEBULIZER TWICE DAILY 01/11/13   [name], MD  aspirin 325 MG tablet Take 325 mg by mouth daily.      Historical Provider, MD  BROVANA 15 MCG/2ML NEBU USE 1 VIAL VIA NEBULIZER TWICE A DAY 12/08/12   [name], MD  budesonide (PULMICORT) 0.5 MG/2ML nebulizer solution USE 1 VIAL ( 0.5MG  TOTAL ) VIA NEBULIZER TWICE DAILY 08/18/12   [name], MD  carbidopa-levodopa-entacapone (STALEVO 150) 37.5-150-200 MG per tablet Take 1 tablet by mouth 4 (four) times daily. 10/01/12   York Spaniel, MD  Multiple Vitamin (MULTIVITAMIN) capsule Take 1 capsule by mouth daily.      Historical Provider, MD  omeprazole (PRILOSEC) 20 MG capsule Take 1 capsule (20 mg total) by mouth daily. 30-60 minutes before first meal of the day 08/18/12   [name], MD  pravastatin (PRAVACHOL) 20 MG tablet  07/03/12   Historical Provider, MD  tiotropium (SPIRIVA HANDIHALER) 18 MCG inhalation capsule Place 1 capsule (18 mcg total) into inhaler and inhale daily. 08/18/12   [name], MD  triamterene-hydrochlorothiazide [facility]) 37.5-25 MG per tablet TAKE 1/2 TABLET BY MOUTH DAILY 08/18/12   [name], MD    ALLERGIES:  Allergies  Allergen Reactions  . Amantadines     Nervousness  . Xanax [Alprazolam]     SOCIAL HISTORY:  History  Substance Use Topics  . Smoking status: Former Smoker -- 3.00 packs/day for 20 years    Types: Cigarettes    Quit date:  04/22/1978  . Smokeless tobacco: Never Used  . Alcohol Use: No    FAMILY HISTORY: Family History  Problem Relation Age of Onset  . Coronary artery disease    . Heart attack Brother   . Heart attack Mother     EXAM: BP 146/66  Pulse 100  Temp(Src) 98.5 F (36.9 C) (Oral)  Resp 26  SpO2 97% CONSTITUTIONAL: Alert and oriented and responds appropriately to questions. Elderly, chronically ill-appearing, moderate respiratory distress HEAD: Normocephalic EYES: Conjunctivae clear, PERRL ENT: normal nose; no rhinorrhea; moist mucous membranes; pharynx without lesions noted NECK: Supple, no meningismus, no LAD  CARD: Tachycardic; S1 and S2 appreciated; no murmurs, no clicks, no rubs, no gallops RESP: Patient is in moderate respiratory distress, tachypneic, diffuse expiratory wheeze, diminished at bases, no rhonchi or rales, mild cyanosis of his lips, patient becomes short of breath with talking and speaks in short sentences ABD/GI: Normal bowel sounds; soft but diffusely tender to palpation with mild voluntary guarding in the right lower quadrant, no peritoneal signs BACK:  The back appears normal and is non-tender to palpation, there is no CVA tenderness EXT: Normal ROM in all joints; non-tender to palpation; no edema; slightly increased capillary refill SKIN: Normal color for age and race; warm NEURO: Moves all extremities equally, no facial droop or slurred speech PSYCH: The patient's mood and manner are appropriate. Grooming and personal hygiene are appropriate.  MEDICAL DECISION MAKING: Patient here with multiple complaints. He is in moderate respiratory distress I suspect a COPD exacerbation. We'll give continuous albuterol, obtain chest x-ray, labs. Patient also complaining of diffuse abdominal pain and difficulty urinating, constipation. No new back pain, numbness or tingling or focal weakness. We'll bladder scan. Patient may need a CT of his abdomen and pelvis is his bladder is not  the source of his discomfort. Abdominal labs, urine also pending. Anticipate patient will need admission.   Date: 01/31/2013 17:39  Rate: 100  Rhythm: Sinus tachycardia  QRS Axis: normal  Intervals: normal  ST/T Wave abnormalities: T-wave inversions in V1 through V3  Conduction Disutrbances: none  Narrative Interpretation:  T wave inversions in anterior leads are new compared to prior EKG, sinus tachycardia    ED PROGRESS: Patient's blood glasses within normal limits. His pH is 7.375, PCO2 46.2, PO2 91, bicarbonate 27.0.  He is currently getting a continuous neb with treatment. His oxygen saturation is 95%.  8:21 PM  Pt foley catheter placed with greater than 1400 mL of urine. He reports immediate relief. Patient does have a history of BPH. His abdominal exam has improved.  Cr elevated to 1.66 likely due to post obstruction.  Abdominal x-ray shows a large fecal impaction. Will attempt to disimpact in the ED. No obstruction. Chest x-ray shows no obvious pneumonia but he does have a leukocytosis of 16 with left shift. Will treat for possible community-acquired pneumonia. Urinalysis pending. Will discuss with hospitalist for  admission.  9:00 PM  Pt is still tachypneic but sats 96% on 3L [place].  Cyanosis resolved.  Lungs have diffuse wheezing.  Will continue nebs.   Pt has large soft stool ball in his rectum that I am unable to disimpact.  Will give SMOG enema.  Urine shows bacteria, leukocytes, blood. Culture pending. He has received ceftriaxone. D/w hospitatlist for admission.    Layla Maw Ward, DO 01/31/13 2102

## 2013-02-01 ENCOUNTER — Inpatient Hospital Stay (HOSPITAL_COMMUNITY): Payer: Medicare Other

## 2013-02-01 ENCOUNTER — Encounter (HOSPITAL_COMMUNITY): Payer: Self-pay | Admitting: *Deleted

## 2013-02-01 LAB — CBC
HCT: 39.6 % (ref 39.0–52.0)
MCH: 32.9 pg (ref 26.0–34.0)
MCHC: 34.6 g/dL (ref 30.0–36.0)
MCV: 95.2 fL (ref 78.0–100.0)
RDW: 13.7 % (ref 11.5–15.5)
WBC: 11.9 10*3/uL — ABNORMAL HIGH (ref 4.0–10.5)

## 2013-02-01 LAB — COMPREHENSIVE METABOLIC PANEL
ALT: <5 U/L (ref 0–53)
AST: 28 U/L (ref 0–37)
Albumin: 3.1 g/dL — ABNORMAL LOW (ref 3.5–5.2)
Alkaline Phosphatase: 61 U/L (ref 39–117)
BUN: 31 mg/dL — ABNORMAL HIGH (ref 6–23)
Chloride: 103 mEq/L (ref 96–112)
Potassium: 3.4 mEq/L — ABNORMAL LOW (ref 3.5–5.1)
Sodium: 141 mEq/L (ref 135–145)
Total Protein: 6.3 g/dL (ref 6.0–8.3)

## 2013-02-01 LAB — TSH: TSH: 0.988 u[IU]/mL (ref 0.350–4.500)

## 2013-02-01 LAB — MRSA PCR SCREENING: MRSA by PCR: NEGATIVE

## 2013-02-01 LAB — MAGNESIUM: Magnesium: 2.4 mg/dL (ref 1.5–2.5)

## 2013-02-01 LAB — PHOSPHORUS: Phosphorus: 3 mg/dL (ref 2.3–4.6)

## 2013-02-01 LAB — TROPONIN I: Troponin I: <0.3 ng/mL (ref <0.30)

## 2013-02-01 MED ORDER — CARBIDOPA-LEVODOPA 25-100 MG PO TABS
1.5000 | ORAL_TABLET | Freq: Four times a day (QID) | ORAL | Status: DC
  Administered 2013-02-01 – 2013-02-04 (×15): 1.5 via ORAL
  Filled 2013-02-01 (×18): qty 1.5

## 2013-02-01 MED ORDER — TAMSULOSIN HCL 0.4 MG PO CAPS
0.4000 mg | ORAL_CAPSULE | Freq: Every day | ORAL | Status: DC
  Administered 2013-02-01 – 2013-02-03 (×3): 0.4 mg via ORAL
  Filled 2013-02-01 (×3): qty 1

## 2013-02-01 MED ORDER — ALBUTEROL SULFATE (5 MG/ML) 0.5% IN NEBU
2.5000 mg | INHALATION_SOLUTION | RESPIRATORY_TRACT | Status: DC | PRN
  Administered 2013-02-02: 05:00:00 2.5 mg via RESPIRATORY_TRACT
  Filled 2013-02-01: qty 0.5

## 2013-02-01 MED ORDER — ARFORMOTEROL TARTRATE 15 MCG/2ML IN NEBU
15.0000 ug | INHALATION_SOLUTION | Freq: Two times a day (BID) | RESPIRATORY_TRACT | Status: DC
  Administered 2013-02-01 – 2013-02-03 (×6): 15 ug via RESPIRATORY_TRACT
  Filled 2013-02-01 (×10): qty 2

## 2013-02-01 MED ORDER — BISACODYL 10 MG RE SUPP: 10.0000 mg | Freq: Every day | RECTAL | Status: DC | PRN

## 2013-02-01 MED ORDER — METHYLPREDNISOLONE SODIUM SUCC 125 MG IJ SOLR
60.0000 mg | Freq: Every day | INTRAMUSCULAR | Status: DC
  Administered 2013-02-01: 60 mg via INTRAVENOUS
  Filled 2013-02-01: qty 0.96

## 2013-02-01 MED ORDER — METHYLPREDNISOLONE SODIUM SUCC 125 MG IJ SOLR
125.0000 mg | Freq: Once | INTRAMUSCULAR | Status: AC
  Administered 2013-02-01: 125 mg via INTRAVENOUS

## 2013-02-01 MED ORDER — ALBUTEROL SULFATE (5 MG/ML) 0.5% IN NEBU
2.5000 mg | INHALATION_SOLUTION | Freq: Four times a day (QID) | RESPIRATORY_TRACT | Status: DC
  Administered 2013-02-01 (×2): 2.5 mg via RESPIRATORY_TRACT
  Filled 2013-02-01 (×2): qty 0.5

## 2013-02-01 MED ORDER — LEVOFLOXACIN IN D5W 750 MG/150ML IV SOLN
750.0000 mg | Freq: Once | INTRAVENOUS | Status: AC
  Administered 2013-02-01: 750 mg via INTRAVENOUS
  Filled 2013-02-01: qty 150

## 2013-02-01 MED ORDER — ONDANSETRON HCL 4 MG/2ML IJ SOLN: 4.0000 mg | Freq: Four times a day (QID) | INTRAMUSCULAR | Status: DC | PRN

## 2013-02-01 MED ORDER — POLYETHYLENE GLYCOL 3350 17 G PO PACK
17.0000 g | PACK | Freq: Every day | ORAL | Status: DC | PRN
  Filled 2013-02-01: qty 1

## 2013-02-01 MED ORDER — BUDESONIDE 0.5 MG/2ML IN SUSP
0.5000 mg | Freq: Two times a day (BID) | RESPIRATORY_TRACT | Status: DC
  Administered 2013-02-01 – 2013-02-03 (×7): 0.5 mg via RESPIRATORY_TRACT
  Filled 2013-02-01 (×10): qty 2

## 2013-02-01 MED ORDER — POLYETHYLENE GLYCOL 3350 17 G PO PACK
17.0000 g | PACK | Freq: Two times a day (BID) | ORAL | Status: DC
  Administered 2013-02-01 – 2013-02-04 (×6): 17 g via ORAL
  Filled 2013-02-01 (×7): qty 1

## 2013-02-01 MED ORDER — ENTACAPONE 200 MG PO TABS
200.0000 mg | ORAL_TABLET | Freq: Four times a day (QID) | ORAL | Status: DC
  Administered 2013-02-01 – 2013-02-04 (×15): 200 mg via ORAL
  Filled 2013-02-01 (×17): qty 1

## 2013-02-01 MED ORDER — CARBIDOPA-LEVODOPA-ENTACAPONE 37.5-150-200 MG PO TABS
1.0000 | ORAL_TABLET | Freq: Four times a day (QID) | ORAL | Status: DC
  Filled 2013-02-01: qty 1

## 2013-02-01 MED ORDER — HYDROCODONE-ACETAMINOPHEN 5-325 MG PO TABS: 1.0000 | ORAL_TABLET | ORAL | Status: DC | PRN

## 2013-02-01 MED ORDER — ONDANSETRON HCL 4 MG PO TABS: 4.0000 mg | ORAL_TABLET | Freq: Four times a day (QID) | ORAL | Status: DC | PRN

## 2013-02-01 MED ORDER — SODIUM CHLORIDE 0.9 % IV SOLN: INTRAVENOUS | Status: DC

## 2013-02-01 MED ORDER — GUAIFENESIN ER 600 MG PO TB12
600.0000 mg | ORAL_TABLET | Freq: Two times a day (BID) | ORAL | Status: DC
  Administered 2013-02-01 – 2013-02-04 (×8): 600 mg via ORAL
  Filled 2013-02-01 (×9): qty 1

## 2013-02-01 MED ORDER — NEBIVOLOL HCL 2.5 MG PO TABS
2.5000 mg | ORAL_TABLET | Freq: Every day | ORAL | Status: DC
  Administered 2013-02-01 – 2013-02-04 (×4): 2.5 mg via ORAL
  Filled 2013-02-01 (×4): qty 1

## 2013-02-01 MED ORDER — LEVOFLOXACIN IN D5W 750 MG/150ML IV SOLN: 750.0000 mg | INTRAVENOUS | Status: DC

## 2013-02-01 MED ORDER — SENNA 8.6 MG PO TABS
1.0000 | ORAL_TABLET | Freq: Two times a day (BID) | ORAL | Status: DC
  Administered 2013-02-01 – 2013-02-04 (×7): 8.6 mg via ORAL
  Filled 2013-02-01 (×8): qty 1

## 2013-02-01 MED ORDER — DOCUSATE SODIUM 100 MG PO CAPS
100.0000 mg | ORAL_CAPSULE | Freq: Two times a day (BID) | ORAL | Status: DC
  Administered 2013-02-01 – 2013-02-04 (×7): 100 mg via ORAL
  Filled 2013-02-01 (×9): qty 1

## 2013-02-01 MED ORDER — PANTOPRAZOLE SODIUM 40 MG PO TBEC
40.0000 mg | DELAYED_RELEASE_TABLET | Freq: Every day | ORAL | Status: DC
  Administered 2013-02-01 – 2013-02-04 (×4): 40 mg via ORAL
  Filled 2013-02-01 (×4): qty 1

## 2013-02-01 MED ORDER — ENOXAPARIN SODIUM 40 MG/0.4ML ~~LOC~~ SOLN
40.0000 mg | SUBCUTANEOUS | Status: DC
  Administered 2013-02-01 – 2013-02-04 (×4): 40 mg via SUBCUTANEOUS
  Filled 2013-02-01 (×4): qty 0.4

## 2013-02-01 MED ORDER — MAGNESIUM CITRATE PO SOLN
1.0000 | Freq: Once | ORAL | Status: AC
  Administered 2013-02-01: 1 via ORAL
  Filled 2013-02-01: qty 296

## 2013-02-01 MED ORDER — TIOTROPIUM BROMIDE MONOHYDRATE 18 MCG IN CAPS
18.0000 ug | ORAL_CAPSULE | Freq: Every day | RESPIRATORY_TRACT | Status: DC
  Administered 2013-02-01 – 2013-02-02 (×2): 18 ug via RESPIRATORY_TRACT
  Filled 2013-02-01: qty 5

## 2013-02-01 MED ORDER — ACETAMINOPHEN 650 MG RE SUPP: 650.0000 mg | Freq: Four times a day (QID) | RECTAL | Status: DC | PRN

## 2013-02-01 MED ORDER — POTASSIUM CHLORIDE CRYS ER 20 MEQ PO TBCR
40.0000 meq | EXTENDED_RELEASE_TABLET | Freq: Two times a day (BID) | ORAL | Status: AC
  Administered 2013-02-01 – 2013-02-02 (×2): 40 meq via ORAL
  Filled 2013-02-01 (×2): qty 2

## 2013-02-01 MED ORDER — SODIUM CHLORIDE 0.9 % IV SOLN
INTRAVENOUS | Status: AC
  Administered 2013-02-01: 75 mL via INTRAVENOUS

## 2013-02-01 MED ORDER — FLEET ENEMA 7-19 GM/118ML RE ENEM
1.0000 | ENEMA | Freq: Once | RECTAL | Status: AC | PRN
  Filled 2013-02-01: qty 1

## 2013-02-01 MED ORDER — PANTOPRAZOLE SODIUM 40 MG PO TBEC: 40.0000 mg | DELAYED_RELEASE_TABLET | Freq: Every day | ORAL | Status: DC

## 2013-02-01 MED ORDER — ACETAMINOPHEN 325 MG PO TABS: 650.0000 mg | ORAL_TABLET | Freq: Four times a day (QID) | ORAL | Status: DC | PRN

## 2013-02-01 MED ORDER — ASPIRIN 325 MG PO TABS
325.0000 mg | ORAL_TABLET | Freq: Every day | ORAL | Status: DC
  Administered 2013-02-01 – 2013-02-04 (×4): 325 mg via ORAL
  Filled 2013-02-01 (×4): qty 1

## 2013-02-01 MED ORDER — SODIUM CHLORIDE 0.9 % IJ SOLN
3.0000 mL | Freq: Two times a day (BID) | INTRAMUSCULAR | Status: DC
  Administered 2013-02-01 – 2013-02-03 (×4): 3 mL via INTRAVENOUS

## 2013-02-01 MED ORDER — IPRATROPIUM BROMIDE 0.02 % IN SOLN
0.5000 mg | Freq: Four times a day (QID) | RESPIRATORY_TRACT | Status: DC
  Administered 2013-02-01 (×2): 0.5 mg via RESPIRATORY_TRACT
  Filled 2013-02-01 (×2): qty 2.5

## 2013-02-01 NOTE — Progress Notes (Signed)
Called report to nurse receiving patient in 5W09. Will transfer via wheelchair. Patient with no complaints at current time.

## 2013-02-01 NOTE — Progress Notes (Signed)
INITIAL NUTRITION ASSESSMENT  DOCUMENTATION CODES Per approved criteria  -Not Applicable   INTERVENTION: 1.  General healthful diet; encourage intake as able.  Consider supplements based on adequacy of intake.   NUTRITION DIAGNOSIS: Altered GI function; bloating related to abdominal distention, impaction as evidenced by x-ray showing significant stool in right colon.   Monitor:  1.  Food/Beverage; pt meeting >/=90% estimated needs with tolerance. 2.  Wt/wt change; monitor trends  Reason for Assessment: MST  79 y.o. male  Admitting Dx: abdominal pain  ASSESSMENT: Pt admitted with abdominal pain and shortness of breath.  Pt with h/o Parkinson's disease as well as COPD. RD met with pt and wife.  Wife reports intake trending less and less over the past few days.  Pt with found with stool impaction and significant burden in right colon.   Since enema and resolve of distended bladder, pt has been feeling better and eating better.  Pt consumed 100% of meal this afternoon. Wife reports pt has been recently less active than usual r/t to breathing and abdominal pain.  Nutrition Focused Physical Exam:  Subcutaneous Fat:  Orbital Region: WNL Upper Arm Region: WNL Thoracic and Lumbar Region: WNL  Muscle:  Temple Region: WNL Clavicle Bone Region: WNL Clavicle and Acromion Bone Region: WNL Scapular Bone Region: WNL Dorsal Hand: WNL Patellar Region: WNL Anterior Thigh Region: mild wasting Posterior Calf Region: WNL  Edema: none present  Height: Ht Readings from Last 1 Encounters:  02/01/13 5\' 9"  (1.753 m)    Weight: Wt Readings from Last 1 Encounters:  02/01/13 185 lb 10 oz (84.2 kg)    Ideal Body Weight: 72.7 kg  % Ideal Body Weight: 115%  Wt Readings from Last 10 Encounters:  02/01/13 185 lb 10 oz (84.2 kg)  10/01/12 184 lb (83.462 kg)  02/17/12 194 lb (87.998 kg)  09/17/12 184 lb (83.462 kg)  08/18/12 190 lb (86.183 kg)  01/30/12 200 lb (90.719 kg)  10/09/11 198  lb 3.2 oz (89.903 kg)  04/09/11 198 lb (89.812 kg)  03/26/11 199 lb (90.266 kg)  03/05/11 199 lb (90.266 kg)    Usual Body Weight: 190 lbs  % Usual Body Weight: 97%  BMI:  Body mass index is 27.4 kg/(m^2).  Estimated Nutritional Needs: Kcal: 1650-1800 Protein: 84-95g Fluid: ~1.8 L/day  Skin: intact  Diet Order: Cardiac  EDUCATION NEEDS: -Education needs addressed   Intake/Output Summary (Last 24 hours) at 02/01/13 1601 Last data filed at 02/01/13 1300  Gross per 24 hour  Intake   1593 ml  Output   2486 ml  Net   -893 ml    Last BM: 10/13   Labs:   Recent Labs Lab 01/31/13 1900 02/01/13 0655  NA 139 141  K 3.7 3.4*  CL 103 103  CO2 24 29  BUN 34* 31*  CREATININE 1.66* 1.15  CALCIUM 9.3 9.3  MG  --  2.4  PHOS  --  3.0  GLUCOSE 150* 128*    CBG (last 3)  No results found for this basename: GLUCAP,  in the last 72 hours  Scheduled Meds: . ipratropium  0.5 mg Nebulization Q6H   And  . albuterol  2.5 mg Nebulization Q6H  . arformoterol  15 mcg Nebulization BID  . aspirin  325 mg Oral Daily  . budesonide  0.5 mg Nebulization BID  . carbidopa-levodopa  1.5 tablet Oral QID   And  . entacapone  200 mg Oral QID  . docusate sodium  100 mg  Oral BID  . enoxaparin (LOVENOX) injection  40 mg Subcutaneous Q24H  . guaiFENesin  600 mg Oral BID  . nebivolol  2.5 mg Oral Daily  . pantoprazole  40 mg Oral Q1200  . senna  1 tablet Oral BID  . sodium chloride  3 mL Intravenous Q12H  . tamsulosin  0.4 mg Oral Daily    Continuous Infusions:   Past Medical History  Diagnosis Date  . GERD (gastroesophageal reflux disease)   . Pure hypercholesterolemia   . Paralysis agitans   . Mixed hyperlipidemia   . Unspecified essential hypertension   . Hypertrophy of prostate without urinary obstruction and other lower urinary tract symptoms (LUTS)   . Obesity, unspecified   . Chronic airway obstruction, not elsewhere classified   . Hypertension   . REM sleep behavior  disorder   . Memory disorder   . Parkinson disease     Past Surgical History  Procedure Laterality Date  . Hemorrhoid surgery    . Tonsillectomy      Loyce Dys, MS RD LDN Clinical Inpatient Dietitian Pager: [phone] Weekend/After hours pager: [phone]

## 2013-02-01 NOTE — Progress Notes (Signed)
Pt did not void after 6 hrs w/o foley. Bladder scan showed ">200". Dr. Sharon Seller ordered to insert foley for tonight and we will try pharm treatment tomorrow.

## 2013-02-01 NOTE — Progress Notes (Signed)
79yo male c/o generalized weakness, distended abdomen w/ pain, decreased urine/bowel output x7d, loss of appetite, and SOB/wheezing, found w/ impacted stool and COPD vs bronchitis, to begin IV ABX.  Will start Levaquin 750mg  IV Q48H for CrCl ~35 ml/min and monitor.  Vernard Gambles, PharmD, BCPS 02/01/2013 12:34 AM

## 2013-02-01 NOTE — Care Management Note (Addendum)
    Page 1 of 1   02/04/2013     3:42:32 PM   CARE MANAGEMENT NOTE 02/04/2013  Patient:  KIRSTEN, MCKONE   Account Number:  [id number]  Date Initiated:  02/01/2013  Documentation initiated by:  Junius Creamer  Subjective/Objective Assessment:   adm w copad exacerb     Action/Plan:   lives w wife, pcp dr Tinnie Gens todd  pt eval- rec st snf or 24 care.   Anticipated DC Date:  02/04/2013   Anticipated DC Plan:  SKILLED NURSING FACILITY  In-house referral  Clinical Social Worker      DC Planning Services  CM consult      Choice offered to / List presented to:             Status of service:  Completed, signed off Medicare Important Message given?   (If response is "NO", the following Medicare IM given date fields will be blank) Date Medicare IM given:   Date Additional Medicare IM given:    Discharge Disposition:  SKILLED NURSING FACILITY  Per UR Regulation:  Reviewed for med. necessity/level of care/duration of stay  If discussed at Long Length of Stay Meetings, dates discussed:    Comments:  02/04/13 15:41 Letha Cape RN, BSN [phone] patient dc to [facility] today, CSW following.  02/03/13 16:07 Letha Cape RN BSN [phone] NCM received referral for [facility] services, went to speak with patient and wife.  The wife states she will be the only one with patient and she will not be able to care for patient by her self at home, they are interested in [facility], [place] [address], I informed the CSW of this information. CSW will try to speak to paitent today, if not will see patient in am, with bed offers.

## 2013-02-01 NOTE — Evaluation (Signed)
Physical Therapy Evaluation Patient Details Name: [name] MRN: [id number] DOB: Jun 08, 1926 Today's Date: 02/01/2013 Time: 6578-4696 PT Time Calculation (min): 18 min  PT Assessment / Plan / Recommendation History of Present Illness  -year-old male patient with history of Parkinson's disease as well as COPD presented to the emergency department with a 24-hour history of abdominal pain and associated shortness of breath. Upon examination in the emergency department he was found to have a distended abdomen and the family reported the patient had not passed a bowel movement in over one week. ER physician attempted disimpaction based on the x-ray patient in addition to having significant stool impaction in the rectum had significant stool in the right colon. He subsequently had an enema and completed. Patient also had a severely distended bladder and a Foley catheter was placed producing 1400 cc of urine. After that was completed abdominal pain resolved. Prior to these treatment measures patient was initially respiratory distress with tachypnea tachycardia but once the bladder was decompressed patient's respiratory symptoms resolved. He was stable on 2 L of nasal cannula oxygen by the time the admitting physician arrived to examine him.  Clinical Impression  Patient demonstrates deficits in functional mobility as indicated below. Pt will benefit from continued skilled PT to address deficits and maximize independence. Will continue to see as indicated. Rec HHPT upon discharge with family supervision and assist.      PT Assessment  Patient needs continued PT services    Follow Up Recommendations  Home health PT;Supervision/Assistance - 24 hour          Equipment Recommendations  None recommended by PT       Frequency Min 4X/week    Precautions / Restrictions Precautions Precautions: Fall Restrictions Weight Bearing Restrictions: No   Pertinent Vitals/Pain No pain reported at this  time      Mobility  Bed Mobility Bed Mobility: Not assessed Transfers Transfers: Sit to Stand;Stand to Sit Sit to Stand: 4: Min assist Stand to Sit: 4: Min guard Details for Transfer Assistance: VCs for hand placement, assist for stability Ambulation/Gait Ambulation/Gait Assistance: 4: Min assist;3: Mod assist Ambulation Distance (Feet): 160 Feet Assistive device: 1 person hand held assist Ambulation/Gait Assistance Details: Assist for stability, more assist as patient became fatigued Gait Pattern: Step-through pattern;Decreased stride length;Narrow base of support Gait velocity: varying General Gait Details: increased instability with fatigue, desaturation with ambulation to 83% on room air Stairs: No        PT Diagnosis: Difficulty walking;Abnormality of gait;Generalized weakness  PT Problem List: Decreased strength;Decreased range of motion;Decreased activity tolerance;Decreased balance;Decreased mobility;Decreased coordination;Decreased cognition;Decreased knowledge of use of DME PT Treatment Interventions: DME instruction;Gait training;Stair training;Functional mobility training;Therapeutic activities;Therapeutic exercise;Balance training;Patient/family education     PT Goals(Current goals can be found in the care plan section) Acute Rehab PT Goals Patient Stated Goal: none stated PT Goal Formulation: With patient/family Time For Goal Achievement: 02/15/13 Potential to Achieve Goals: Fair  Visit Information  Last PT Received On: 02/01/13 Assistance Needed: +1 Reason Eval/Treat Not Completed: Other (comment) (pt transferring rooms currently) History of Present Illness: -year-old male patient with history of Parkinson's disease as well as COPD presented to the emergency department with a 24-hour history of abdominal pain and associated shortness of breath. Upon examination in the emergency department he was found to have a distended abdomen and the family reported the  patient had not passed a bowel movement in over one week. ER physician attempted disimpaction based on the x-ray patient in addition  to having significant stool impaction in the rectum had significant stool in the right colon. He subsequently had an enema and completed. Patient also had a severely distended bladder and a Foley catheter was placed producing 1400 cc of urine. After that was completed abdominal pain resolved. Prior to these treatment measures patient was initially respiratory distress with tachypnea tachycardia but once the bladder was decompressed patient's respiratory symptoms resolved. He was stable on 2 L of nasal cannula oxygen by the time the admitting physician arrived to examine him.       Prior Functioning  Home Living Family/patient expects to be discharged to:: Private residence Living Arrangements: Spouse/significant other Available Help at Discharge: Family Type of Home: House Home Access: Stairs to enter Secretary/administrator of Steps: 1 Entrance Stairs-Rails: None Home Layout: One level Prior Function Level of Independence: Independent Dominant Hand: Right    Cognition  Cognition Arousal/Alertness: Awake/alert Behavior During Therapy: WFL for tasks assessed/performed Overall Cognitive Status: History of cognitive impairments - at baseline    Extremity/Trunk Assessment Upper Extremity Assessment Upper Extremity Assessment: Defer to OT evaluation Lower Extremity Assessment Lower Extremity Assessment: Generalized weakness      End of Session PT - End of Session Equipment Utilized During Treatment: Gait belt Activity Tolerance: Patient tolerated treatment well;Patient limited by fatigue Patient left: in chair;with call bell/phone within reach;with family/visitor present Nurse Communication: Mobility status;Other (comment) (need for breathing treatment)  GP     Fabio Asa 02/01/2013, 3:49 PM Charlotte Crumb, PT DPT  [phone]

## 2013-02-01 NOTE — Telephone Encounter (Signed)
[facility] Triage Call Report Triage Record Num: [id number] Operator: Albertine Grates Patient Name: Teyton Pattillo Call Date & Time: 01/31/2013 4:24:40PM Patient Phone: [phone] PCP: Eugenio Hoes. Todd Patient Gender: Male PCP Fax : [phone] Patient DOB: 19-Jan-1927 [facility] Name: [facility] Reason for Call:  Caller: Lori/Other; PCP: Kelle Darting (Family Practice); CB#: [phone]; Daughter Lawson Fiscal calling and states patient has complained with abdominal pain 10-12. Has been disoriented since waking. Knows family but nothing else. Has been sleeping most of day and unable to walk. Per Abdominal Pain protocol, advised 911 due to symptoms indicative of Shock.  Protocol(s) Used: Abdominal Pain Recommended Outcome per Protocol: Activate EMS 911 Reason for Outcome: New or worsening signs and symptoms that may indicate shock

## 2013-02-01 NOTE — Progress Notes (Signed)
PT Cancellation Note  Patient Details Name: [name] MRN: [id number] DOB: [birth year]   Cancelled Treatment:    Reason Eval/Treat Not Completed: Other (comment) (pt transferring rooms currently)   Delorse Lek 02/01/2013, 1:58 PM [name], PT [phone]

## 2013-02-01 NOTE — Progress Notes (Signed)
[facility] Progress Note [name]   [name] ZOX:[id number] DOB: 1926/10/19 DOA: 01/31/2013 PCP: Evette Georges, MD  Brief narrative: 79 year old male patient with history of Parkinson's disease as well as COPD presented to the emergency department with a 24-hour history of abdominal pain and associated shortness of breath. Upon examination in the emergency department he was found to have a distended abdomen and the family reported the patient had not passed a bowel movement in over one week. ER physician attempted disimpaction.  Based on the x-ray patient in addition to having significant stool impaction in the rectum had significant stool in the right colon. Patient also had a severely distended bladder and a Foley catheter was placed producing 1400 cc of urine. After that was completed abdominal pain resolved. Prior to these treatment measures patient was initially in respiratory distress with tachypnea and tachycardia but once the bladder was decompressed patient's respiratory symptoms resolved.  Assessment/Plan:    Acute-on-chronic respiratory failure/COPD exacerbation -due to mechanical issues from obstipation and distended bladder -sx's have resolved after tx of those problems -dc anbx's and steroids    Obstipation -severe - give mg citrate - may require golytlely if no response to Mg -cont senokot and colace    HYPERTENSION -cont home meds    BENIGN PROSTATIC HYPERTROPHY/ Urinary retention -cont Flomax -cont foley until obstipation resolved then voiding trial    Tachycardia -resolved - due to discomfort    PARKINSON'S DISEASE -cont home meds -PT/OT eval    GERD   DVT prophylaxis: Lovenox Code Status: Full Family Communication: Patient and wife at bedside Disposition Plan/Expected LOS: Transfer to floor  Consultants: None  Procedures: None  Antibiotics: Levaquin 10/12 Rocephin 10/12 Azithromycin  10/12  HPI/Subjective: Patient alert but seems somewhat confused. Currently denies shortness of breath, chest pain, abdominal pain. Wife at bedside.  Objective: Blood pressure 120/97, pulse 91, temperature 97.8 F (36.6 C), temperature source Oral, resp. rate 28, height 5\' 9"  (1.753 m), weight 84.2 kg (185 lb 10 oz), SpO2 96.00%.  Intake/Output Summary (Last 24 hours) at 02/01/13 1252 Last data filed at 02/01/13 1200  Gross per 24 hour  Intake    993 ml  Output   2486 ml  Net  -1493 ml   Exam: General: No acute respiratory distress Lungs: Clear to auscultation bilaterally without wheezes or crackles, 2L Cardiovascular: Regular rate and rhythm without murmur gallop or rub normal S1 and S2, no peripheral edema or JVD Abdomen: Nontender, nondistended, soft, bowel sounds positive, no rebound, no ascites, no appreciable mass Musculoskeletal: No significant cyanosis, clubbing of bilateral lower extremities Neurological: Alert and oriented x name, moves all extremities x 4 without focal neurological deficits, CN 2-12 intact  Scheduled Meds:  Scheduled Meds: . ipratropium  0.5 mg Nebulization Q6H   And  . albuterol  2.5 mg Nebulization Q6H  . arformoterol  15 mcg Nebulization BID  . aspirin  325 mg Oral Daily  . budesonide  0.5 mg Nebulization BID  . carbidopa-levodopa  1.5 tablet Oral QID   And  . entacapone  200 mg Oral QID  . docusate sodium  100 mg Oral BID  . enoxaparin (LOVENOX) injection  40 mg Subcutaneous Q24H  . guaiFENesin  600 mg Oral BID  . [START ON 02/03/2013] levofloxacin (LEVAQUIN) IV  750 mg Intravenous Q48H  . methylPREDNISolone (SOLU-MEDROL) injection  60 mg Intravenous Daily  . nebivolol  2.5 mg Oral Daily  . pantoprazole  40 mg  Oral Q1200  . senna  1 tablet Oral BID  . sodium chloride  3 mL Intravenous Q12H  . tamsulosin  0.4 mg Oral Daily    Data Reviewed: Basic Metabolic Panel:  Recent Labs Lab 01/31/13 1900 02/01/13 0655  NA 139 141  K 3.7 3.4*   CL 103 103  CO2 24 29  GLUCOSE 150* 128*  BUN 34* 31*  CREATININE 1.66* 1.15  CALCIUM 9.3 9.3  MG  --  2.4  PHOS  --  3.0   Liver Function Tests:  Recent Labs Lab 01/31/13 1900 02/01/13 0655  AST 39* 28  ALT 8 <5  ALKPHOS 69 61  BILITOT 1.4* 0.7  PROT 6.7 6.3  ALBUMIN 3.5 3.1*    Recent Labs Lab 01/31/13 1900  LIPASE 8*   CBC:  Recent Labs Lab 01/31/13 1900 02/01/13 0655  WBC 15.0* 11.9*  NEUTROABS 14.1*  --   HGB 15.2 13.7  HCT 42.9 39.6  MCV 94.7 95.2  PLT 127* 145*   Cardiac Enzymes:  Recent Labs Lab 02/01/13 0050 02/01/13 0655 02/01/13 1150  TROPONINI <0.30 <0.30 <0.30   BNP (last 3 results)  Recent Labs  01/31/13 1900  PROBNP 872.0*    Recent Results (from the past 240 hour(s))  MRSA PCR SCREENING     Status: None   Collection Time    01/31/13 11:49 PM      Result Value Range Status   MRSA by PCR NEGATIVE  NEGATIVE Final   Comment:            The GeneXpert MRSA Assay (FDA     approved for NASAL specimens     only), is one component of a     comprehensive MRSA colonization     surveillance program. It is not     intended to diagnose MRSA     infection nor to guide or     monitor treatment for     MRSA infections.     Studies:  Recent x-ray studies have been reviewed in detail by the Attending Physician     Junious Silk, ANP [facility] Office  [phone] Pager [phone]  **If unable to reach the above provider after paging please contact the Flow Manager @ [phone]  On-Call/Text Page:      [email or web address]      password [email or web address]  If 7PM-7AM, please contact night-coverage [email or web address] Password [email or web address] 02/01/2013, 12:52 PM   LOS: 1 day   I have personally examined this patient and reviewed the entire database. I have reviewed the above note, made any necessary editorial changes, and agree with its content.  Lonia Blood, MD [facility]

## 2013-02-02 DIAGNOSIS — I517 Cardiomegaly: Secondary | ICD-10-CM

## 2013-02-02 DIAGNOSIS — E7800 Pure hypercholesterolemia, unspecified: Secondary | ICD-10-CM

## 2013-02-02 DIAGNOSIS — J9620 Acute and chronic respiratory failure, unspecified whether with hypoxia or hypercapnia: Principal | ICD-10-CM

## 2013-02-02 DIAGNOSIS — G2581 Restless legs syndrome: Secondary | ICD-10-CM

## 2013-02-02 LAB — URINE CULTURE: Colony Count: NO GROWTH

## 2013-02-02 MED ORDER — FUROSEMIDE 10 MG/ML IJ SOLN
20.0000 mg | Freq: Once | INTRAMUSCULAR | Status: AC
  Administered 2013-02-02: 13:00:00 20 mg via INTRAVENOUS
  Filled 2013-02-02 (×2): qty 2

## 2013-02-02 MED ORDER — NITROGLYCERIN 2 % TD OINT
1.0000 [in_us] | TOPICAL_OINTMENT | Freq: Three times a day (TID) | TRANSDERMAL | Status: AC
  Administered 2013-02-02 – 2013-02-04 (×6): 1 [in_us] via TOPICAL
  Filled 2013-02-02: qty 30

## 2013-02-02 MED ORDER — POLYETHYLENE GLYCOL 3350 17 GM/SCOOP PO POWD
1.0000 | Freq: Once | ORAL | Status: AC
  Administered 2013-02-02: 1 via ORAL
  Filled 2013-02-02: qty 255

## 2013-02-02 MED ORDER — ALBUTEROL SULFATE (5 MG/ML) 0.5% IN NEBU
2.5000 mg | INHALATION_SOLUTION | Freq: Four times a day (QID) | RESPIRATORY_TRACT | Status: DC
  Administered 2013-02-02 – 2013-02-03 (×6): 2.5 mg via RESPIRATORY_TRACT
  Filled 2013-02-02 (×7): qty 0.5

## 2013-02-02 MED ORDER — TAMSULOSIN HCL 0.4 MG PO CAPS: 0.4000 mg | ORAL_CAPSULE | Freq: Every day | ORAL | Status: DC

## 2013-02-02 MED ORDER — LEVOFLOXACIN IN D5W 750 MG/150ML IV SOLN
750.0000 mg | INTRAVENOUS | Status: DC
  Administered 2013-02-02: 10:00:00 750 mg via INTRAVENOUS
  Filled 2013-02-02 (×2): qty 150

## 2013-02-02 MED ORDER — METHYLPREDNISOLONE SODIUM SUCC 125 MG IJ SOLR
80.0000 mg | Freq: Four times a day (QID) | INTRAMUSCULAR | Status: DC
  Administered 2013-02-02 – 2013-02-03 (×5): 80 mg via INTRAVENOUS
  Filled 2013-02-02 (×7): qty 1.28
  Filled 2013-02-02: qty 2
  Filled 2013-02-02: qty 1.28

## 2013-02-02 NOTE — Progress Notes (Signed)
OT Cancellation Note  Patient Details Name: KARVER FADDEN MRN: [id number] DOB: December 23, 1926   Cancelled Treatment:     Reason Eval not completed, pt not currently medically able per chart review and MD recommendations during rounds this am. Will plan to assess/eval for OT when medically able.  Roselie Awkward Dixon 02/02/2013, 9:54 AM

## 2013-02-02 NOTE — Progress Notes (Signed)
Pt with complaints of SOB & wheezing. Paged respiratory for a breathing treatment. Pt with a o2 sat of on 88% on 2 L of oxygen. Moved oxygen up to 3L [place].Oxygen moved up to 3l and sat moved up to 95%. Paged rapid response. Pt doing better now. Will continue to monitor pt.

## 2013-02-02 NOTE — Progress Notes (Signed)
  Echocardiogram 2D Echocardiogram has been performed.  [name] 02/02/2013, 5:11 PM

## 2013-02-02 NOTE — Progress Notes (Signed)
Called by bedside RN regarding shortness of breath and wheezing. RT in room giving breathing treatment, patient's O2 sats increased after treatment, oxygen had been removed during patient's sleep. Wheezing is noted in upper airway and not improved from treatment. Patient states he is feeling better and is pleasantly confused. Will continue to monitor, advised bedside RN to call if needed

## 2013-02-02 NOTE — Progress Notes (Signed)
PT Cancellation Note  Patient Details Name: [name] MRN: [id number] DOB: Dec 02, 1926   Cancelled Treatment:    Reason Eval/Treat Not Completed: Medical issues which prohibited therapy   Pookela Sellin 02/02/2013, 1:18 PM

## 2013-02-02 NOTE — Progress Notes (Signed)
[facility] PROGRESS NOTE  RANDI COLLEGE UJW:[id number] DOB: 11-27-1926 DOA: 01/31/2013 PCP: Evette Georges, MD  HPI/Subjective:  Patient reports shallow breathing but denies chest pain.   Assessment/Plan:    Acute on chronic respiratory failure due to combination of COPD Exacerbation along with fluid overload and abdominal distention and diaphragmatic compression caused by massive stool burden from constipation and urinary retention causing bladder distention.  Oxygen, pulmonary toiletry, nebulizer treatments, chest PT ordered. Ordered IV Solu-Medrol along with Levaquin, nebulizer and oxygen treatments as above Stop IV fluids and  Ordered Lasix along with echogram. Keep head of the bed elevated. Will add nitro paste.      Obstipation with large stool burden  Severely distended abdomen with last BM > 1 week ago, unable to manually disimpact Head stool disimpaction along with For an MI and bowel regimen.    Urinary Retention Despite Flomax, has underlying BPH. Foley catheter placed over 1 L of fluid removed instantly. Outpatient urology followup post discharge    Fluid Overload with elevated Pro BNP and Rales On physical exam, positive JVD, IV Lasix given 02/02/13 2D Echo ordered DC IVF     Hypertension  Controlled with home BP medications    Parkinson's Disease Continue current regimen     Code Status: DNR Family Communication: wife at the bedside Disposition Plan: Inpatient at this time   Consultants:  None  Procedures:  None  Antibiotics:  Levaquin: Started 02/02/13  DVT Prophylaxis:  Lovenox  Objective: Filed Vitals:   02/02/13 0903  BP: 132/71  Pulse: 71  Temp: 97.6 F (36.4 C)  Resp:     Intake/Output Summary (Last 24 hours) at 02/02/13 1041 Last data filed at 02/01/13 2350  Gross per 24 hour  Intake 1206.67 ml  Output    160 ml  Net 1046.67 ml   Filed Weights   02/01/13 0018  Weight: 84.2 kg (185 lb 10 oz)     Exam:   General:  Well-developed, elderly Caucasian male, out of breath when speaking  Cardiovascular: unable to clearly hear rate and rhythm, no obvious rubs, gallops, or murmurs  Respiratory: audible wheezes, paradoxical breathing, no crackles or rales  Abdomen: firm, distended, bowel sounds present, mild tenderness to palpation in RLQ, dullness to percussion  Musculoskeletal: no edema  Data Reviewed: Basic Metabolic Panel:  Recent Labs Lab 01/31/13 1900 02/01/13 0655 02/02/13 0908  NA 139 141  --   K 3.7 3.4* 4.5  CL 103 103  --   CO2 24 29  --   GLUCOSE 150* 128*  --   BUN 34* 31*  --   CREATININE 1.66* 1.15  --   CALCIUM 9.3 9.3  --   MG  --  2.4  --   PHOS  --  3.0  --    Liver Function Tests:  Recent Labs Lab 01/31/13 1900 02/01/13 0655  AST 39* 28  ALT 8 <5  ALKPHOS 69 61  BILITOT 1.4* 0.7  PROT 6.7 6.3  ALBUMIN 3.5 3.1*    Recent Labs Lab 01/31/13 1900  LIPASE 8*   CBC:  Recent Labs Lab 01/31/13 1900 02/01/13 0655  WBC 15.0* 11.9*  NEUTROABS 14.1*  --   HGB 15.2 13.7  HCT 42.9 39.6  MCV 94.7 95.2  PLT 127* 145*   Cardiac Enzymes:  Recent Labs Lab 02/01/13 0050 02/01/13 0655 02/01/13 1150  TROPONINI <0.30 <0.30 <0.30   BNP (last 3 results)  Recent Labs  01/31/13 1900  PROBNP 872.0*  Recent Results (from the past 240 hour(s))  CULTURE, BLOOD (ROUTINE X 2)     Status: None   Collection Time    01/31/13  6:56 PM      Result Value Range Status   Specimen Description BLOOD RIGHT HAND   Final   Special Requests BOTTLES DRAWN AEROBIC ONLY 10CC   Final   Culture  Setup Time     Final   Value: 02/01/2013 01:45     Performed at [facility]   Culture     Final   Value:        BLOOD CULTURE RECEIVED NO GROWTH TO DATE CULTURE WILL BE HELD FOR 5 DAYS BEFORE ISSUING A FINAL NEGATIVE REPORT     Performed at [facility]   Report Status PENDING   Incomplete  CULTURE, BLOOD (ROUTINE X 2)     Status: None    Collection Time    01/31/13  7:00 PM      Result Value Range Status   Specimen Description BLOOD LEFT HAND   Final   Special Requests BOTTLES DRAWN AEROBIC ONLY 5CC   Final   Culture  Setup Time     Final   Value: 02/01/2013 01:44     Performed at [facility]   Culture     Final   Value:        BLOOD CULTURE RECEIVED NO GROWTH TO DATE CULTURE WILL BE HELD FOR 5 DAYS BEFORE ISSUING A FINAL NEGATIVE REPORT     Performed at [facility]   Report Status PENDING   Incomplete  URINE CULTURE     Status: None   Collection Time    01/31/13  8:17 PM      Result Value Range Status   Specimen Description URINE, CATHETERIZED   Final   Special Requests ADDED 2048   Final   Culture  Setup Time     Final   Value: 01/31/2013 21:16     Performed at [facility] Count     Final   Value: NO GROWTH     Performed at [facility]   Culture     Final   Value: NO GROWTH     Performed at [facility]   Report Status 02/02/2013 FINAL   Final  MRSA PCR SCREENING     Status: None   Collection Time    01/31/13 11:49 PM      Result Value Range Status   MRSA by PCR NEGATIVE  NEGATIVE Final   Comment:            The GeneXpert MRSA Assay (FDA     approved for NASAL specimens     only), is one component of a     comprehensive MRSA colonization     surveillance program. It is not     intended to diagnose MRSA     infection nor to guide or     monitor treatment for     MRSA infections.     Studies: Abd 1 View (kub)  02/01/2013   CLINICAL DATA:  Abdominal discomfort.  EXAM: ABDOMEN - 1 VIEW  COMPARISON:  01/31/2013  FINDINGS: Nonobstructive bowel gas pattern. Large amount of stool throughout the colon. Cannot exclude fecal impaction. Findings are similar to prior study. No free air. Calcifications in the lower pelvis, likely prostate. No acute bony abnormality.  IMPRESSION: Stable large stool volume throughout the colon. Question fecal impaction.  Electronically Signed   By: Charlett Nose M.D.   On: 02/01/2013 07:11   Dg Chest Port 1 View  01/31/2013   CLINICAL DATA:  Generalized weakness.  EXAM: PORTABLE CHEST - 1 VIEW  COMPARISON:  PA and lateral chest 11/22/2010.  FINDINGS: The lungs are emphysematous but clear. Heart size is normal. No pneumothorax or pleural fluid. Degenerative change about the left shoulder noted.  IMPRESSION: Emphysema without acute disease.   Electronically Signed   By: Drusilla Kanner M.D.   On: 01/31/2013 20:09   Dg Abd Portable 2v  01/31/2013   CLINICAL DATA:  Abdominal distention and constipation for 7 days.  EXAM: PORTABLE ABDOMEN - 2 VIEW  COMPARISON:  None.  FINDINGS: There is no free intraperitoneal air or evidence of bowel obstruction. The patient has a very large volume of stool throughout the colon including a large stool ball in the rectum. No abnormal abdominal calcification is seen. No focal bony abnormality.  IMPRESSION: No acute finding.  Constipation with a large stool ball in the rectum compatible with fecal impaction.   Electronically Signed   By: Drusilla Kanner M.D.   On: 01/31/2013 20:10    Scheduled Meds: . albuterol  2.5 mg Nebulization Q6H  . arformoterol  15 mcg Nebulization BID  . aspirin  325 mg Oral Daily  . budesonide  0.5 mg Nebulization BID  . carbidopa-levodopa  1.5 tablet Oral QID   And  . entacapone  200 mg Oral QID  . docusate sodium  100 mg Oral BID  . enoxaparin (LOVENOX) injection  40 mg Subcutaneous Q24H  . furosemide  20 mg Intravenous Once  . guaiFENesin  600 mg Oral BID  . levofloxacin (LEVAQUIN) IV  750 mg Intravenous Q24H  . methylPREDNISolone (SOLU-MEDROL) injection  80 mg Intravenous Q6H  . nebivolol  2.5 mg Oral Daily  . pantoprazole  40 mg Oral Q1200  . polyethylene glycol  17 g Oral BID  . polyethylene glycol powder  1 Container Oral Once  . senna  1 tablet Oral BID  . sodium chloride  3 mL Intravenous Q12H  . tamsulosin  0.4 mg Oral Daily   Continuous  Infusions:   Active Problems:   PARKINSON'S DISEASE   HYPERTENSION   GERD   BENIGN PROSTATIC HYPERTROPHY   COPD exacerbation   Obstipation   Tachycardia   Urinary retention   Acute-on-chronic respiratory failure    DENNIN, SARA A PA-S  [facility] If 7PM-7AM, please contact night-coverage at [email or web address], password [facility] 02/02/2013, 10:41 AM  LOS: 2 days      I have directly reviewed the clinical findings, lab, imaging studies and management of this patient in detail. I have interviewed and examined the patient and agree with the documentation,  as recorded by the Physician extender/Student.  Leroy Sea M.D on 02/02/2013 at 11:48 AM  [facility] Office  [phone]

## 2013-02-03 DIAGNOSIS — R413 Other amnesia: Secondary | ICD-10-CM

## 2013-02-03 DIAGNOSIS — J449 Chronic obstructive pulmonary disease, unspecified: Secondary | ICD-10-CM

## 2013-02-03 LAB — BASIC METABOLIC PANEL
BUN: 31 mg/dL — ABNORMAL HIGH (ref 6–23)
CO2: 32 mEq/L (ref 19–32)
Calcium: 9.3 mg/dL (ref 8.4–10.5)
Creatinine, Ser: 0.91 mg/dL (ref 0.50–1.35)
GFR calc non Af Amer: 76 mL/min — ABNORMAL LOW (ref >90)
Glucose, Bld: 153 mg/dL — ABNORMAL HIGH (ref 70–99)
Potassium: 4.4 mEq/L (ref 3.5–5.1)
Sodium: 144 mEq/L (ref 135–145)

## 2013-02-03 LAB — CBC
MCH: 31.8 pg (ref 26.0–34.0)
MCHC: 32.4 g/dL (ref 30.0–36.0)
MCV: 97.9 fL (ref 78.0–100.0)
Platelets: 157 10*3/uL (ref 150–400)
RBC: 4.22 MIL/uL (ref 4.22–5.81)

## 2013-02-03 MED ORDER — LEVOFLOXACIN 750 MG PO TABS
750.0000 mg | ORAL_TABLET | Freq: Every day | ORAL | Status: DC
  Administered 2013-02-03 – 2013-02-04 (×2): 750 mg via ORAL
  Filled 2013-02-03 (×2): qty 1

## 2013-02-03 MED ORDER — FUROSEMIDE 10 MG/ML IJ SOLN
40.0000 mg | Freq: Once | INTRAMUSCULAR | Status: AC
  Administered 2013-02-03: 40 mg via INTRAVENOUS
  Filled 2013-02-03: qty 4

## 2013-02-03 MED ORDER — METHYLPREDNISOLONE SODIUM SUCC 40 MG IJ SOLR
40.0000 mg | Freq: Two times a day (BID) | INTRAMUSCULAR | Status: DC
  Administered 2013-02-03: 40 mg via INTRAVENOUS
  Filled 2013-02-03 (×3): qty 1

## 2013-02-03 MED ORDER — TAMSULOSIN HCL 0.4 MG PO CAPS
0.8000 mg | ORAL_CAPSULE | Freq: Every day | ORAL | Status: DC
  Administered 2013-02-04: 0.8 mg via ORAL
  Filled 2013-02-03: qty 2

## 2013-02-03 MED ORDER — MAGNESIUM CITRATE PO SOLN
1.0000 | Freq: Once | ORAL | Status: AC
  Administered 2013-02-03: 1 via ORAL
  Filled 2013-02-03: qty 296

## 2013-02-03 NOTE — Progress Notes (Signed)
Patient evaluated for community based chronic disease management services with [facility] Care Management Program as a benefit of patient's [employer]. Spoke with patient's wife and children at bedside to explain [facility] Care Management services.  Family feels that they can manage the patients care at this time when he goes home.  Left contact information and [facility] literature at bedside. Made inpatient Case Manager aware that [facility] Care Management has evaluated. Of note, [facility] Care Management services does not replace or interfere with any services that are arranged by inpatient case management or social work.  For additional questions or referrals please contact Anibal Henderson BSN RN [facility] Liaison at [phone].

## 2013-02-03 NOTE — Progress Notes (Addendum)
Physical Therapy Treatment Patient Details Name: [name] MRN: [id number] DOB: [birth year] Today's Date: 02/03/2013 Time: 9528-4132 PT Time Calculation (min): 25 min  PT Assessment / Plan / Recommendation  History of Present Illness -year-old male patient with history of Parkinson's disease as well as COPD presented to the emergency department with a 24-hour history of abdominal pain and associated shortness of breath. Upon examination in the emergency department he was found to have a distended abdomen and the family reported the patient had not passed a bowel movement in over one week. ER physician attempted disimpaction based on the x-ray patient in addition to having significant stool impaction in the rectum had significant stool in the right colon. He subsequently had an enema and completed. Patient also had a severely distended bladder and a Foley catheter was placed producing 1400 cc of urine. After that was completed abdominal pain resolved. Prior to these treatment measures patient was initially respiratory distress with tachypnea tachycardia but once the bladder was decompressed patient's respiratory symptoms resolved. He was stable on 2 L of nasal cannula oxygen by the time the admitting physician arrived to examine him.   PT Comments   Pt very unsteady with gt today and seemed to fatigue quickly vs evaluation note. Pt required mod-max (A) for amb with handheld (A) and (A) around waist to maintain balance. Pt thrusting posteriorly constantly throughout the session and unaware of deficits. Discussed pt being a high fall risk with pt and wife. Pt would benefit from STSNF prior to returning home.  Follow Up Recommendations  Supervision/Assistance - 24 hour;SNF     Does the patient have the potential to tolerate intense rehabilitation     Barriers to Discharge        Equipment Recommendations  None recommended by PT    Recommendations for Other Services OT consult  Frequency  Min 4X/week   Progress towards PT Goals Progress towards PT goals: Progressing toward goals  Plan Discharge plan needs to be updated    Precautions / Restrictions Precautions Precautions: Fall Precaution Comments: Pt on 3L O2 today; Foley  Restrictions Weight Bearing Restrictions: No   Pertinent Vitals/Pain No c/o pain; O2 destat sitting EOB on 3L O2 to 90%.     Mobility  Bed Mobility Bed Mobility: Supine to Sit;Sitting - Scoot to Edge of Bed Supine to Sit: 4: Min assist;HOB elevated;With rails Sitting - Scoot to Edge of Bed: 5: Supervision Details for Bed Mobility Assistance: (A) to bring trunk to EOB; pt requires incr time; HOB elevated and pt relies on rails for supine to sit; cues for hand placement and sequencing Transfers Transfers: Stand to Sit;Sit to Stand;Stand Pivot Transfers Sit to Stand: 3: Mod assist;From bed;From elevated surface;With upper extremity assist Stand to Sit: 3: Mod assist;To chair/3-in-1;With armrests;With upper extremity assist Stand Pivot Transfers: 3: Mod assist;From elevated surface;With armrests Details for Transfer Assistance: performed sit to stand x 2; first time attempted to amb; pt very unsteady with amb and required return to bed to then perform SPT bed to chair  Ambulation/Gait Ambulation/Gait Assistance: 3: Mod assist;2: Max assist Ambulation Distance (Feet): 6 Feet Assistive device: 1 person hand held assist;Other (Comment) (also with (A) around trunk ) Ambulation/Gait Assistance Details: pt required incr (A) today and was very usteady with short distance amb; pt thrusting posteriorly and unaware of posterior lean; max cues for safety and mod to max (A) for amb to maintain balance; pt is a high fall risk  Gait Pattern: Step-through pattern;Decreased stride  length;Narrow base of support Gait velocity: very decreased  General Gait Details: pt amb on 3L O2 as per RN request (pt demo labored breathing ) Stairs: No Wheelchair Mobility Wheelchair  Mobility: No    Exercises General Exercises - Lower Extremity Ankle Circles/Pumps: PROM;10 reps;Seated   PT Diagnosis:    PT Problem List:   PT Treatment Interventions:     PT Goals (current goals can now be found in the care plan section) Acute Rehab PT Goals Patient Stated Goal: to go to my home PT Goal Formulation: With patient/family Time For Goal Achievement: 02/15/13 Potential to Achieve Goals: Fair  Visit Information  Last PT Received On: 02/03/13 Assistance Needed: +2 (for ambulation; pt very unsteady today ) History of Present Illness: -year-old male patient with history of Parkinson's disease as well as COPD presented to the emergency department with a 24-hour history of abdominal pain and associated shortness of breath. Upon examination in the emergency department he was found to have a distended abdomen and the family reported the patient had not passed a bowel movement in over one week. ER physician attempted disimpaction based on the x-ray patient in addition to having significant stool impaction in the rectum had significant stool in the right colon. He subsequently had an enema and completed. Patient also had a severely distended bladder and a Foley catheter was placed producing 1400 cc of urine. After that was completed abdominal pain resolved. Prior to these treatment measures patient was initially respiratory distress with tachypnea tachycardia but once the bladder was decompressed patient's respiratory symptoms resolved. He was stable on 2 L of nasal cannula oxygen by the time the admitting physician arrived to examine him.    Subjective Data  Subjective: Pt lying supine; agreeable to therapy and amb to chair for breakfast  Patient Stated Goal: to go to my home   Cognition  Cognition Arousal/Alertness: Awake/alert Behavior During Therapy: WFL for tasks assessed/performed Overall Cognitive Status: History of cognitive impairments - at baseline    Balance   Balance Balance Assessed: Yes Static Sitting Balance Static Sitting - Balance Support: No upper extremity supported;Feet supported Static Sitting - Level of Assistance: 5: Stand by assistance Static Sitting - Comment/# of Minutes: tolerated sitting EOB ~5 min; pt with posterior lean continuously  Static Standing Balance Static Standing - Balance Support: Right upper extremity supported;During functional activity Static Standing - Level of Assistance: 3: Mod assist Static Standing - Comment/# of Minutes: pt thrusting posteriorly with static standing   End of Session PT - End of Session Equipment Utilized During Treatment: Gait belt;Oxygen Activity Tolerance: Patient limited by fatigue Patient left: in chair;with call bell/phone within reach;with family/visitor present Nurse Communication: Mobility status   GP     Donell Sievert , Connell [phone]  02/03/2013, 11:53 AM

## 2013-02-03 NOTE — Evaluation (Addendum)
Occupational Therapy Evaluation Patient Details Name: [name] MRN: [id number] DOB: [birth year] Today's Date: 02/03/2013 Time: 4098-1191 OT Time Calculation (min): 28 min  OT Assessment / Plan / Recommendation History of present illness 79 year-old male patient with history of Parkinson's disease as well as COPD presented to the emergency department with a 24-hour history of abdominal pain and associated shortness of breath. Upon examination in the emergency department he was found to have a distended abdomen and the family reported the patient had not passed a bowel movement in over one week. ER physician attempted disimpaction based on the x-ray patient in addition to having significant stool impaction in the rectum had significant stool in the right colon. He subsequently had an enema and completed. Patient also had a severely distended bladder and a Foley catheter was placed producing 1400 cc of urine. After that was completed abdominal pain resolved. Prior to these treatment measures patient was initially respiratory distress with tachypnea tachycardia but once the bladder was decompressed patient's respiratory symptoms resolved. He was stable on 2 L of nasal cannula oxygen by the time the admitting physician arrived to examine him.   Clinical Impression   Pt presents with below problem list. Pt unsteady with ambulation. Pt independent with ADLs, PTA. Pt will benefit from acute OT to increase independence and safety prior to d/c. Do not feel pt is safe to d/c home with wife. Spoke with them about going to SNF for short term rehab before going home.     OT Assessment  Patient needs continued OT Services    Follow Up Recommendations  SNF;Supervision/Assistance - 24 hour    Barriers to Discharge      Equipment Recommendations  3 in 1 bedside comode    Recommendations for Other Services    Frequency  Min 2X/week    Precautions / Restrictions Precautions Precautions:  Fall Precaution Comments: Pt on 3L O2 today; Foley  Restrictions Weight Bearing Restrictions: No   Pertinent Vitals/Pain No pain reported. O2 85% after ambulating around bed and returning to chair on 3 L of O2. Educated on deep breathing and O2 trended up. Nurse notified.     ADL  Eating/Feeding: Independent Where Assessed - Eating/Feeding: Chair Grooming: Set up;Supervision/safety Where Assessed - Grooming: Supported sitting Upper Body Bathing: Set up;Supervision/safety Where Assessed - Upper Body Bathing: Supported sitting Lower Body Bathing: Minimal assistance Where Assessed - Lower Body Bathing: Unsupported sit to stand Upper Body Dressing: Set up;Supervision/safety Where Assessed - Upper Body Dressing: Supported sitting Lower Body Dressing: Minimal assistance Where Assessed - Lower Body Dressing: Unsupported sit to stand Toilet Transfer: Minimal assistance Toilet Transfer Method: Sit to stand Toilet Transfer Equipment: Other (comment) (from recliner chair) Tub/Shower Transfer Method: Not assessed Equipment Used: Gait belt;Other (comment) (oxygen) Transfers/Ambulation Related to ADLs: Mod A for ambulation. Min A for transfers. ADL Comments: Educated on energy conservation techniques to pt/family. Recommended having rugs picked up in house and educated on safe shoe wear as well. Recommended not attempting shower transfer at this time as pt was very unsteady.  Discussed 3 in 1 and how it may be easier to get up and down as it is higher and has armrests.    OT Diagnosis: Generalized weakness  OT Problem List: Decreased strength;Decreased activity tolerance;Impaired balance (sitting and/or standing);Decreased knowledge of use of DME or AE;Decreased knowledge of precautions;Cardiopulmonary status limiting activity OT Treatment Interventions: Self-care/ADL training;DME and/or AE instruction;Therapeutic activities;Patient/family education;Balance training   OT Goals(Current goals can be  found in  the care plan section) Acute Rehab OT Goals Patient Stated Goal: get back to golf OT Goal Formulation: With patient Time For Goal Achievement: 02/10/13 Potential to Achieve Goals: Good  Visit Information  Last OT Received On: 02/03/13 Assistance Needed: +2 (safety with ambulation) History of Present Illness: 79 year-old male patient with history of Parkinson's disease as well as COPD presented to the emergency department with a 24-hour history of abdominal pain and associated shortness of breath. Upon examination in the emergency department he was found to have a distended abdomen and the family reported the patient had not passed a bowel movement in over one week. ER physician attempted disimpaction based on the x-ray patient in addition to having significant stool impaction in the rectum had significant stool in the right colon. He subsequently had an enema and completed. Patient also had a severely distended bladder and a Foley catheter was placed producing 1400 cc of urine. After that was completed abdominal pain resolved. Prior to these treatment measures patient was initially respiratory distress with tachypnea tachycardia but once the bladder was decompressed patient's respiratory symptoms resolved. He was stable on 2 L of nasal cannula oxygen by the time the admitting physician arrived to examine him.       Prior Functioning     Home Living Family/patient expects to be discharged to:: Private residence Living Arrangements: Spouse/significant other Available Help at Discharge: Family;Available 24 hours/day Type of Home: House Home Access: Stairs to enter [employer] of Steps: 1 Entrance Stairs-Rails: None Home Layout: One level Home Equipment: Grab bars - tub/shower;Grab bars - toilet Prior Function Level of Independence: Independent Communication Communication: No difficulties Dominant Hand: Right         Vision/Perception     Cognition   Cognition Arousal/Alertness: Awake/alert Behavior During Therapy: WFL for tasks assessed/performed Overall Cognitive Status: History of cognitive impairments - at baseline    Extremity/Trunk Assessment Upper Extremity Assessment Upper Extremity Assessment: Overall WFL for tasks assessed     Mobility Bed Mobility Bed Mobility: Not assessed Transfers Transfers: Sit to Stand;Stand to Sit Sit to Stand: 4: Min assist;With upper extremity assist;From chair/3-in-1 Stand to Sit: 4: Min assist;With upper extremity assist;To chair/3-in-1 Details for Transfer Assistance: A to control descent to chair     Exercise     Balance     End of Session OT - End of Session Equipment Utilized During Treatment: Gait belt;Oxygen Activity Tolerance: Patient limited by fatigue Patient left: in chair;with call bell/phone within reach;with family/visitor present Nurse Communication: Mobility status;Other (comment) (O2 sats)  GO     Earlie Raveling OTR/L [phone] 02/03/2013, 2:23 PM

## 2013-02-03 NOTE — Progress Notes (Signed)
[facility] PROGRESS NOTE  [name] WUJ:[id number] DOB: [birth year] DOA: 01/31/2013 PCP: Evette Georges, MD  HPI/Subjective:  Reports he is feeling better today.   Assessment/Plan:  Acute on chronic respiratory failure with hypoxia. COPD Exacerbation, fluid overload and diaphragmatic compression caused by massive stool burden and urinary retention. Oxygen 3 liters (increased requirement), pulmonary toiletry, nebulizer treatments, chest PT ordered. Patient improving.  WBC has normalized. Decrease IV solumedrol 10/15 and plan for prednisone starting 10/16. Continue Levaquin (day 2) 2D echo completed (Can not rule out diastolic heart failure). HHRN for Respiratory treatments. Check eligibility for COPD Gold Program  Will need follow up with Dr. [name] post discharge.    Obstipation with large stool burden  Severely distended abdomen with last BM > 1 week ago,  unable to manually disimpact. Enemas have failed.  Continue sipping miralax bowel prep and add Mag citrate.   If no significant results by this afternoon will consider theraputic gastrografin enema.   Urinary Retention Despite Flomax, has underlying BPH. Will increase dose to 0.8 (10/15) Foley catheter placed over 1 L of fluid removed instantly (10/14). Outpatient urology followup post discharge [facility] for foley care   Fluid Overload and Rales positive JVD, IV Lasix given 10/14 and 10/15. 2D Echo completed. Results below.   Hypertension  Controlled with home BP medications    Parkinson's Disease Continue current regimen   Code Status: DNR Family Communication: wife at the bedside Disposition Plan: Inpatient at this time   Consultants:  None  Procedures: 2D Echo: Left ventricle  - The cavity size was normal. Wall thickness was increased in a pattern of mild LVH. Systolic function was normal. The estimated ejection fraction was in the range of 55% to 60%. Although no diagnostic regional wall  motion abnormality was identified, this possibility cannot be completely excluded on the basis of this study.  Antibiotics:  Levaquin: Started 02/02/13  DVT Prophylaxis:  Lovenox  Objective: Filed Vitals:   02/03/13 0455  BP: 159/83  Pulse: 77  Resp: 18    Intake/Output Summary (Last 24 hours) at 02/03/13 1046 Last data filed at 02/03/13 0950  Gross per 24 hour  Intake    103 ml  Output   4400 ml  Net  -4297 ml   Filed Weights   02/01/13 0018  Weight: 84.2 kg (185 lb 10 oz)    Exam:   General:  Well-developed, elderly Caucasian male, sleeping, awakens easily.  Cardiovascular: regular rate, no obvious rubs, gallops, or murmurs  Respiratory: audible wheezes likely in throat/pharynx, paradoxical breathing, no crackles or rales  Abdomen: soft, distended, bowel sounds present  Musculoskeletal: no edema, able to move all extremities.  Data Reviewed: Basic Metabolic Panel:  Recent Labs Lab 01/31/13 1900 02/01/13 0655 02/02/13 0908 02/03/13 0612  NA 139 141  --  144  K 3.7 3.4* 4.5 4.4  CL 103 103  --  104  CO2 24 29  --  32  GLUCOSE 150* 128*  --  153*  BUN 34* 31*  --  31*  CREATININE 1.66* 1.15  --  0.91  CALCIUM 9.3 9.3  --  9.3  MG  --  2.4  --   --   PHOS  --  3.0  --   --    Liver Function Tests:  Recent Labs Lab 01/31/13 1900 02/01/13 0655  AST 39* 28  ALT 8 <5  ALKPHOS 69 61  BILITOT 1.4* 0.7  PROT 6.7 6.3  ALBUMIN 3.5 3.1*  Recent Labs Lab 01/31/13 1900  LIPASE 8*   CBC:  Recent Labs Lab 01/31/13 1900 02/01/13 0655 02/03/13 0612  WBC 15.0* 11.9* 10.0  NEUTROABS 14.1*  --   --   HGB 15.2 13.7 13.4  HCT 42.9 39.6 41.3  MCV 94.7 95.2 97.9  PLT 127* 145* 157   Cardiac Enzymes:  Recent Labs Lab 02/01/13 0050 02/01/13 0655 02/01/13 1150  TROPONINI <0.30 <0.30 <0.30   BNP (last 3 results)  Recent Labs  01/31/13 1900  PROBNP 872.0*    Recent Results (from the past 240 hour(s))  CULTURE, BLOOD (ROUTINE X 2)      Status: None   Collection Time    01/31/13  6:56 PM      Result Value Range Status   Specimen Description BLOOD RIGHT HAND   Final   Special Requests BOTTLES DRAWN AEROBIC ONLY 10CC   Final   Culture  Setup Time     Final   Value: 02/01/2013 01:45     Performed at [facility]   Culture     Final   Value:        BLOOD CULTURE RECEIVED NO GROWTH TO DATE CULTURE WILL BE HELD FOR 5 DAYS BEFORE ISSUING A FINAL NEGATIVE REPORT     Performed at [facility]   Report Status PENDING   Incomplete  CULTURE, BLOOD (ROUTINE X 2)     Status: None   Collection Time    01/31/13  7:00 PM      Result Value Range Status   Specimen Description BLOOD LEFT HAND   Final   Special Requests BOTTLES DRAWN AEROBIC ONLY 5CC   Final   Culture  Setup Time     Final   Value: 02/01/2013 01:44     Performed at [facility]   Culture     Final   Value:        BLOOD CULTURE RECEIVED NO GROWTH TO DATE CULTURE WILL BE HELD FOR 5 DAYS BEFORE ISSUING A FINAL NEGATIVE REPORT     Performed at [facility]   Report Status PENDING   Incomplete  URINE CULTURE     Status: None   Collection Time    01/31/13  8:17 PM      Result Value Range Status   Specimen Description URINE, CATHETERIZED   Final   Special Requests ADDED 2048   Final   Culture  Setup Time     Final   Value: 01/31/2013 21:16     Performed at [facility] Count     Final   Value: NO GROWTH     Performed at [facility]   Culture     Final   Value: NO GROWTH     Performed at [facility]   Report Status 02/02/2013 FINAL   Final  MRSA PCR SCREENING     Status: None   Collection Time    01/31/13 11:49 PM      Result Value Range Status   MRSA by PCR NEGATIVE  NEGATIVE Final   Comment:            The GeneXpert MRSA Assay (FDA     approved for NASAL specimens     only), is one component of a     comprehensive MRSA colonization     surveillance program. It is not     intended to  diagnose MRSA     infection nor  to guide or     monitor treatment for     MRSA infections.     Studies: Abd 1 View (kub)  02/01/2013   CLINICAL DATA:  Abdominal discomfort.  EXAM: ABDOMEN - 1 VIEW  COMPARISON:  01/31/2013  FINDINGS: Nonobstructive bowel gas pattern. Large amount of stool throughout the colon. Cannot exclude fecal impaction. Findings are similar to prior study. No free air. Calcifications in the lower pelvis, likely prostate. No acute bony abnormality.  IMPRESSION: Stable large stool volume throughout the colon. Question fecal impaction.   Electronically Signed   By: Charlett Nose M.D.   On: 02/01/2013 07:11   Dg Chest Port 1 View  01/31/2013   CLINICAL DATA:  Generalized weakness.  EXAM: PORTABLE CHEST - 1 VIEW  COMPARISON:  PA and lateral chest 11/22/2010.  FINDINGS: The lungs are emphysematous but clear. Heart size is normal. No pneumothorax or pleural fluid. Degenerative change about the left shoulder noted.  IMPRESSION: Emphysema without acute disease.   Electronically Signed   By: Drusilla Kanner M.D.   On: 01/31/2013 20:09   Dg Abd Portable 2v  01/31/2013   CLINICAL DATA:  Abdominal distention and constipation for 7 days.  EXAM: PORTABLE ABDOMEN - 2 VIEW  COMPARISON:  None.  FINDINGS: There is no free intraperitoneal air or evidence of bowel obstruction. The patient has a very large volume of stool throughout the colon including a large stool ball in the rectum. No abnormal abdominal calcification is seen. No focal bony abnormality.  IMPRESSION: No acute finding.  Constipation with a large stool ball in the rectum compatible with fecal impaction.   Electronically Signed   By: Drusilla Kanner M.D.   On: 01/31/2013 20:10    Scheduled Meds: . albuterol  2.5 mg Nebulization Q6H  . arformoterol  15 mcg Nebulization BID  . aspirin  325 mg Oral Daily  . budesonide  0.5 mg Nebulization BID  . carbidopa-levodopa  1.5 tablet Oral QID   And  . entacapone  200 mg Oral QID  .  docusate sodium  100 mg Oral BID  . enoxaparin (LOVENOX) injection  40 mg Subcutaneous Q24H  . furosemide  40 mg Intravenous Once  . guaiFENesin  600 mg Oral BID  . levofloxacin  750 mg Oral Daily  . magnesium citrate  1 Bottle Oral Once  . methylPREDNISolone (SOLU-MEDROL) injection  40 mg Intravenous Q12H  . nebivolol  2.5 mg Oral Daily  . nitroGLYCERIN  1 inch Topical Q8H  . pantoprazole  40 mg Oral Q1200  . polyethylene glycol  17 g Oral BID  . senna  1 tablet Oral BID  . sodium chloride  3 mL Intravenous Q12H  . [START ON 02/04/2013] tamsulosin  0.8 mg Oral Daily   Continuous Infusions:   Active Problems:   PARKINSON'S DISEASE   HYPERTENSION   GERD   BENIGN PROSTATIC HYPERTROPHY   COPD exacerbation   Obstipation   Tachycardia   Urinary retention   Acute-on-chronic respiratory failure    Romualdo Bolk  [facility] [phone] If 7PM-7AM, please contact night-coverage at [email or web address], password [facility] 02/03/2013, 10:46 AM  LOS: 3 days

## 2013-02-03 NOTE — Progress Notes (Signed)
Addendum  Patient seen and examined, chart and data base reviewed.  I agree with the above assessment and plan.  For full details please see Mrs. Algis Downs PA note.  Acute on chronic respiratory failure with COPD exacerbation.  Obstipation, on multiple laxatives monitor for BM as patient is unreliable historian.   Clint Lipps, MD [facility] Pager: [phone] 02/03/2013, 10:59 AM

## 2013-02-04 LAB — BASIC METABOLIC PANEL
BUN: 41 mg/dL — ABNORMAL HIGH (ref 6–23)
Calcium: 9.3 mg/dL (ref 8.4–10.5)
Chloride: 100 mEq/L (ref 96–112)
Creatinine, Ser: 1.02 mg/dL (ref 0.50–1.35)
Glucose, Bld: 124 mg/dL — ABNORMAL HIGH (ref 70–99)
Potassium: 4.6 mEq/L (ref 3.5–5.1)

## 2013-02-04 MED ORDER — LEVOFLOXACIN 750 MG PO TABS: 750.0000 mg | ORAL_TABLET | Freq: Every day | ORAL | Status: DC

## 2013-02-04 MED ORDER — PREDNISONE 50 MG PO TABS
60.0000 mg | ORAL_TABLET | Freq: Once | ORAL | Status: AC
  Administered 2013-02-04: 13:00:00 60 mg via ORAL
  Filled 2013-02-04: qty 1

## 2013-02-04 MED ORDER — POLYETHYLENE GLYCOL 3350 17 G PO PACK: 17.0000 g | PACK | Freq: Every day | ORAL | Status: AC

## 2013-02-04 MED ORDER — PREDNISONE 10 MG PO TABS: ORAL_TABLET | ORAL | Status: DC

## 2013-02-04 MED ORDER — ALBUTEROL SULFATE (5 MG/ML) 0.5% IN NEBU
2.5000 mg | INHALATION_SOLUTION | Freq: Four times a day (QID) | RESPIRATORY_TRACT | Status: DC
  Administered 2013-02-04: 13:00:00 2.5 mg via RESPIRATORY_TRACT
  Filled 2013-02-04 (×2): qty 0.5

## 2013-02-04 MED ORDER — TAMSULOSIN HCL 0.4 MG PO CAPS: 0.4000 mg | ORAL_CAPSULE | Freq: Every day | ORAL | Status: AC

## 2013-02-04 NOTE — Discharge Summary (Signed)
Physician Discharge Summary  [name]:[id number] DOB: [birth year] DOA: 01/31/2013  PCP: Evette Georges, MD  Admit date: 01/31/2013 Discharge date: 02/04/2013  Time spent: 40 minutes  Recommendations for Outpatient Follow-up:  1. Followup with [facility] on 02/16/2013 at 11 AM. 2. Followup with primary care physician in one week.  Discharge Diagnoses:  Active Problems:   PARKINSON'S DISEASE   HYPERTENSION   GERD   BENIGN PROSTATIC HYPERTROPHY   COPD exacerbation   Obstipation   Tachycardia   Urinary retention   Acute-on-chronic respiratory failure   Discharge Condition: Stable  Diet recommendation: Heart healthy  Filed Weights   02/01/13 0018  Weight: 84.2 kg (185 lb 10 oz)    History of present illness:  Patient has hx of severe COPD followed by Dr. [name]. He presented today with 24 hour hx of abdominal pain and shortness of breath. In ER he was found to have distended abdomen. Family states no BM >1 week. ER MD attempted disimpaction but stool was too far up. Enema was ordered. Also patient has severely distended bladder and foley was placed producing 1400 cc of urine. Currently abdominal pain has resolved. Patient initially was in respiratory distress with tachypnea and tachycardia now improved after getting nebulizer treatments but not back to baseline. He is at baseline on 2 L of O2 which he is on now. He remains tachypneic but states he is much improved. Denies any fever,or cough. Family states breathing got worse after he developed abdominal pain. But he has had progressive worsening of shortness of breath for some time.   Hospital Course:   1. Acute on chronic respiratory failure: Acute on chronic hypoxic respiratory failure, secondary to COPD exacerbation. Other factors contributing also is mild fluid overload, diaphragmatic compression from masses stool burden. Patient usually on 2 L of oxygen increased to 3 L of oxygen the time of admission,  patient treated with antibiotics and pulmonary toileting including inhaled bronchodilators, mucolytics and antitussives. Patient also was placed on systemic steroids, was on Solu-Medrol until yesterday, switched to oral prednisone today, patient to have 5 more days of Levaquin as outpatient. Patient will followup followup with Dr. [name] as hospital followup.  2. Obstipation: With large stool burden, likely secondary to Stalevo. Patient came in with abdominal distention and abdominal pain, with last bowel movement more than 8 days PTA, patient was placed on magnesium citrate, MiraLax and enemas. Has multiple bowel movements and abdominal distention/pain resolved. Patient placed on MiraLax daily.  3. Urinary retention: Patient has history of BPH, he is on Flomax. Developed acute urinary retention, after introducing the Foley catheter over 1400 cc of fluids retrieved. Started on Flomax 0.4 mg, patient discharged with his Foley catheter, will followup with Dr. Berneice Heinrich of [facility] on 02/16/2013.  4. Parkinson's disease: Patient is on Stalevo, continue home medications.  5. Hypertension: His home medications continued throughout the hospital stay.  6. Mild fluid overload: The patient had positive JVD, this could be secondary to IV fluid patient had upon admission. Started on Lasix and 2-D echocardiogram was done and showed normal LVEF. I think patient does not need Lasix on discharge.  Procedures:  None  Consultations:  None  Discharge Exam: Filed Vitals:   02/04/13 0500  BP: 120/66  Pulse: 74  Temp: 98.2 F (36.8 C)  Resp: 18   General: Alert and awake, oriented x3, not in any acute distress. HEENT: anicteric sclera, pupils reactive to light and accommodation, EOMI CVS: S1-S2 clear, no murmur rubs or  gallops Chest: clear to auscultation bilaterally, no wheezing, rales or rhonchi Abdomen: soft nontender, nondistended, normal bowel sounds, no organomegaly Extremities: no cyanosis,  clubbing or edema noted bilaterally Neuro: Cranial nerves II-XII intact, no focal neurological deficits  Discharge Instructions   Future Appointments Provider Department Dept Phone   02/08/2013 12:00 PM [name], MD [facility] [phone]   04/26/2013 11:00 AM York Spaniel, MD [facility] [phone]       Medication List         albuterol 108 (90 BASE) MCG/ACT inhaler  Commonly known as:  PROVENTIL HFA;VENTOLIN HFA  Inhale 2 puffs into the lungs every 4 (four) hours as needed.     arformoterol 15 MCG/2ML Nebu  Commonly known as:  BROVANA  Take 15 mcg by nebulization 2 (two) times daily.     aspirin 325 MG tablet  Take 325 mg by mouth daily.     budesonide 0.5 MG/2ML nebulizer solution  Commonly known as:  PULMICORT  Take 0.5 mg by nebulization 2 (two) times daily.     carbidopa-levodopa-entacapone 37.5-150-200 MG per tablet  Commonly known as:  STALEVO  Take 1 tablet by mouth 4 (four) times daily.     levofloxacin 750 MG tablet  Commonly known as:  LEVAQUIN  Take 1 tablet (750 mg total) by mouth daily.     multivitamin capsule  Take 1 capsule by mouth daily.     nebivolol 5 MG tablet  Commonly known as:  BYSTOLIC  Take 2.5 mg by mouth daily.     omeprazole 20 MG capsule  Commonly known as:  PRILOSEC  Take 1 capsule (20 mg total) by mouth daily. 30-60 minutes before first meal of the day     polyethylene glycol packet  Commonly known as:  MIRALAX / GLYCOLAX  Take 17 g by mouth daily.     predniSONE 10 MG tablet  Commonly known as:  DELTASONE  Take 6-5-4-3-2-1 PO daily till gone     tamsulosin 0.4 MG Caps capsule  Commonly known as:  FLOMAX  Take 1 capsule (0.4 mg total) by mouth daily.     tiotropium 18 MCG inhalation capsule  Commonly known as:  SPIRIVA HANDIHALER  Place 1 capsule (18 mcg total) into inhaler and inhale daily.       No Known Allergies     Follow-up Information   Follow up with Sandrea Hughs,  MD On 02/08/2013. (12:00 pm to see Dr. [name].)    Specialty:  Pulmonary Disease   Contact information:   [address] [place] [address] [phone]       Follow up with TODD,JEFFREY ALLEN, MD. Schedule an appointment as soon as possible for a visit in 3 weeks.   Specialty:  Family Medicine   Contact information:   [address] [place] [address] [phone]       Follow up with Sebastian Ache, MD On 02/16/2013. (@11 :00 AM)    Specialty:  Urology   Contact information:   [address] [place] [address] [phone]        The results of significant diagnostics from this hospitalization (including imaging, microbiology, ancillary and laboratory) are listed below for reference.    Significant Diagnostic Studies: Abd 1 View (kub)  02/01/2013   CLINICAL DATA:  Abdominal discomfort.  EXAM: ABDOMEN - 1 VIEW  COMPARISON:  01/31/2013  FINDINGS: Nonobstructive bowel gas pattern. Large amount of stool throughout the colon. Cannot exclude fecal  impaction. Findings are similar to prior study. No free air. Calcifications in the lower pelvis, likely prostate. No acute bony abnormality.  IMPRESSION: Stable large stool volume throughout the colon. Question fecal impaction.   Electronically Signed   By: Charlett Nose M.D.   On: 02/01/2013 07:11   Dg Chest Port 1 View  01/31/2013   CLINICAL DATA:  Generalized weakness.  EXAM: PORTABLE CHEST - 1 VIEW  COMPARISON:  PA and lateral chest 11/22/2010.  FINDINGS: The lungs are emphysematous but clear. Heart size is normal. No pneumothorax or pleural fluid. Degenerative change about the left shoulder noted.  IMPRESSION: Emphysema without acute disease.   Electronically Signed   By: Drusilla Kanner M.D.   On: 01/31/2013 20:09   Dg Abd Portable 2v  01/31/2013   CLINICAL DATA:  Abdominal distention and constipation for 7 days.  EXAM: PORTABLE ABDOMEN - 2 VIEW  COMPARISON:  None.  FINDINGS: There is no free  intraperitoneal air or evidence of bowel obstruction. The patient has a very large volume of stool throughout the colon including a large stool ball in the rectum. No abnormal abdominal calcification is seen. No focal bony abnormality.  IMPRESSION: No acute finding.  Constipation with a large stool ball in the rectum compatible with fecal impaction.   Electronically Signed   By: Drusilla Kanner M.D.   On: 01/31/2013 20:10    Microbiology: Recent Results (from the past 240 hour(s))  CULTURE, BLOOD (ROUTINE X 2)     Status: None   Collection Time    01/31/13  6:56 PM      Result Value Range Status   Specimen Description BLOOD RIGHT HAND   Final   Special Requests BOTTLES DRAWN AEROBIC ONLY 10CC   Final   Culture  Setup Time     Final   Value: 02/01/2013 01:45     Performed at [facility]   Culture     Final   Value:        BLOOD CULTURE RECEIVED NO GROWTH TO DATE CULTURE WILL BE HELD FOR 5 DAYS BEFORE ISSUING A FINAL NEGATIVE REPORT     Performed at [facility]   Report Status PENDING   Incomplete  CULTURE, BLOOD (ROUTINE X 2)     Status: None   Collection Time    01/31/13  7:00 PM      Result Value Range Status   Specimen Description BLOOD LEFT HAND   Final   Special Requests BOTTLES DRAWN AEROBIC ONLY 5CC   Final   Culture  Setup Time     Final   Value: 02/01/2013 01:44     Performed at [facility]   Culture     Final   Value:        BLOOD CULTURE RECEIVED NO GROWTH TO DATE CULTURE WILL BE HELD FOR 5 DAYS BEFORE ISSUING A FINAL NEGATIVE REPORT     Performed at [facility]   Report Status PENDING   Incomplete  URINE CULTURE     Status: None   Collection Time    01/31/13  8:17 PM      Result Value Range Status   Specimen Description URINE, CATHETERIZED   Final   Special Requests ADDED 2048   Final   Culture  Setup Time     Final   Value: 01/31/2013 21:16     Performed at [facility] Count     Final   Value: NO  GROWTH      Performed at [facility]   Culture     Final   Value: NO GROWTH     Performed at [facility]   Report Status 02/02/2013 FINAL   Final  MRSA PCR SCREENING     Status: None   Collection Time    01/31/13 11:49 PM      Result Value Range Status   MRSA by PCR NEGATIVE  NEGATIVE Final   Comment:            The GeneXpert MRSA Assay (FDA     approved for NASAL specimens     only), is one component of a     comprehensive MRSA colonization     surveillance program. It is not     intended to diagnose MRSA     infection nor to guide or     monitor treatment for     MRSA infections.     Labs: Basic Metabolic Panel:  Recent Labs Lab 01/31/13 1900 02/01/13 0655 02/02/13 0908 02/03/13 0612 02/04/13 0536  NA 139 141  --  144 140  K 3.7 3.4* 4.5 4.4 4.6  CL 103 103  --  104 100  CO2 24 29  --  32 33*  GLUCOSE 150* 128*  --  153* 124*  BUN 34* 31*  --  31* 41*  CREATININE 1.66* 1.15  --  0.91 1.02  CALCIUM 9.3 9.3  --  9.3 9.3  MG  --  2.4  --   --   --   PHOS  --  3.0  --   --   --    Liver Function Tests:  Recent Labs Lab 01/31/13 1900 02/01/13 0655  AST 39* 28  ALT 8 <5  ALKPHOS 69 61  BILITOT 1.4* 0.7  PROT 6.7 6.3  ALBUMIN 3.5 3.1*    Recent Labs Lab 01/31/13 1900  LIPASE 8*   No results found for this basename: AMMONIA,  in the last 168 hours CBC:  Recent Labs Lab 01/31/13 1900 02/01/13 0655 02/03/13 0612  WBC 15.0* 11.9* 10.0  NEUTROABS 14.1*  --   --   HGB 15.2 13.7 13.4  HCT 42.9 39.6 41.3  MCV 94.7 95.2 97.9  PLT 127* 145* 157   Cardiac Enzymes:  Recent Labs Lab 02/01/13 0050 02/01/13 0655 02/01/13 1150  TROPONINI <0.30 <0.30 <0.30   BNP: BNP (last 3 results)  Recent Labs  01/31/13 1900  PROBNP 872.0*   CBG: No results found for this basename: GLUCAP,  in the last 168 hours     Signed:  Skiler Olden A  [facility] 02/04/2013, 11:57 AM

## 2013-02-04 NOTE — Progress Notes (Signed)
Pt was combative when respiratory was trying to give a breathing treatment.

## 2013-02-04 NOTE — Progress Notes (Signed)
Occupational Therapy Treatment Patient Details Name: [name] MRN: [id number] DOB: 1926-06-19 Today's Date: 02/04/2013 Time: 0865-7846 OT Time Calculation (min): 9 min  OT Assessment / Plan / Recommendation  History of present illness 79 year-old male patient with history of Parkinson's disease as well as COPD presented to the emergency department with a 24-hour history of abdominal pain and associated shortness of breath. Upon examination in the emergency department he was found to have a distended abdomen and the family reported the patient had not passed a bowel movement in over one week. ER physician attempted disimpaction based on the x-ray patient in addition to having significant stool impaction in the rectum had significant stool in the right colon. He subsequently had an enema and completed. Patient also had a severely distended bladder and a Foley catheter was placed producing 1400 cc of urine. After that was completed abdominal pain resolved. Prior to these treatment measures patient was initially respiratory distress with tachypnea tachycardia but once the bladder was decompressed patient's respiratory symptoms resolved. He was stable on 2 L of nasal cannula oxygen by the time the admitting physician arrived to examine him.   OT comments  Pt was educated in energy conservation techniques this am. Handout was issued and reviewed w/ both pt and pt's wife secondary to pt lethergy and sleepy this am. Pt will benefit from cont OT as per plan of care to assist in maximizing independence w/ ADL's.  Follow Up Recommendations  SNF;Supervision/Assistance - 24 hour    Barriers to Discharge       Equipment Recommendations  3 in 1 bedside comode    Recommendations for Other Services    Frequency Min 2X/week   Progress towards OT Goals Progress towards OT goals: Progressing toward goals  Plan Discharge plan remains appropriate    Precautions / Restrictions Precautions Precautions:  Fall Precaution Comments: Pt on 3L O2 today; Foley    Pertinent Vitals/Pain No c/o pain    ADL  ADL Comments: Educated on energy conservation techniques to pt/family today, handout reviewed and issued. Pt lethargic/sleepy this am therefore handout was reviewed w/ both pt & pt's wife. Pt falling back to sleep after verbal review, pt's wife was awake and verbalized understanding of energy conservation techniques.     OT Diagnosis:    OT Problem List:   OT Treatment Interventions:     OT Goals(current goals can now be found in the care plan section)    Visit Information  Last OT Received On: 02/04/13 Assistance Needed: +2 History of Present Illness: 79 year-old male patient with history of Parkinson's disease as well as COPD presented to the emergency department with a 24-hour history of abdominal pain and associated shortness of breath. Upon examination in the emergency department he was found to have a distended abdomen and the family reported the patient had not passed a bowel movement in over one week. ER physician attempted disimpaction based on the x-ray patient in addition to having significant stool impaction in the rectum had significant stool in the right colon. He subsequently had an enema and completed. Patient also had a severely distended bladder and a Foley catheter was placed producing 1400 cc of urine. After that was completed abdominal pain resolved. Prior to these treatment measures patient was initially respiratory distress with tachypnea tachycardia but once the bladder was decompressed patient's respiratory symptoms resolved. He was stable on 2 L of nasal cannula oxygen by the time the admitting physician arrived to examine him.  Subjective Data      Prior Functioning       Cognition       Mobility  Bed Mobility Bed Mobility: Not assessed Transfers Transfers: Not assessed    Exercises  Other Exercises Other Exercises: Energy conservation techniques reviewed  w/ pt & pt's wife this am. Handout was issued and left in pt room.       End of Session OT - End of Session Activity Tolerance: Patient limited by fatigue Patient left: with call bell/phone within reach;with bed alarm set;with family/visitor present;in bed  GO     [name] 02/04/2013, 7:53 AM

## 2013-02-04 NOTE — Progress Notes (Signed)
Patient is confused. Neb not given as patient has become combative. Patient placed back on 3L nasal cannula with help from RN.

## 2013-02-04 NOTE — Progress Notes (Signed)
Pt prepared for d/c to SNF. IV d/c'd. Skin intact except as most recently charted. Vitals are stable. Report called to receiving facility. Pt  transported by ambulance service.  

## 2013-02-07 LAB — CULTURE, BLOOD (ROUTINE X 2): Culture: NO GROWTH

## 2013-02-08 ENCOUNTER — Encounter: Payer: Self-pay | Admitting: Internal Medicine

## 2013-02-08 ENCOUNTER — Ambulatory Visit (INDEPENDENT_AMBULATORY_CARE_PROVIDER_SITE_OTHER): Payer: Medicare Other | Admitting: Internal Medicine

## 2013-02-08 VITALS — BP 112/60 | HR 63 | Temp 98.0°F | Ht 70.0 in | Wt 180.0 lb

## 2013-02-08 DIAGNOSIS — J9610 Chronic respiratory failure, unspecified whether with hypoxia or hypercapnia: Secondary | ICD-10-CM

## 2013-02-08 DIAGNOSIS — J449 Chronic obstructive pulmonary disease, unspecified: Secondary | ICD-10-CM

## 2013-02-08 NOTE — Progress Notes (Signed)
Subjective:     Patient ID: [name], male   DOB: 22-Jan-1927   MRN: [id number]  Brief patient profile:  85  yowm quit smoking 1980  COPD with an FEV1 42% predicted with a ratio of 38% December 05, 2005,   baseline = sob from mb back to house without stopping but once gets to house sit down because it's uphill but can go 10-15 min flat without stopping.    HPI 11/22/2010 ov/[name] cc not able to play golf due to sob. No cough. slt increase in leg swelling all worse x 6 months, indolent onset, minimally progressive. rec Please see patient coordinator before you leave today  to schedule pulmonary rehabilitation and ambulatory 02 at 2lpm > never went to rehab " scheduling issue"   01/03/2011 f/u ov/[name] no rehab yet,  Wearing 02 at hs only. Minimal assoc cough/ sense of congestion. Confused with timing of multiple meds Sleeping ok without nocturnal  or early am exac of resp c/o's or need for noct saba.  >>>changed tenoretic to bystolic     02/08/2013 post hosp f/u ov/[name] re: copd Chief Complaint  Patient presents with  . Follow-up    HFU - impacted bowel and urinary incontincence    On spiriva before and after admit with bowel and urinary dysfunction - sob at rest attributed to abd distention.  No obvious day to day or daytime variabilty or assoc chronic cough or cp or chest tightness, subjective wheeze overt sinus or hb symptoms. No unusual exp hx or h/o childhood pna/ asthma or knowledge of premature birth.  Sleeping ok without nocturnal  or early am exacerbation  of respiratory  c/o's or need for noct saba. Also denies any obvious fluctuation of symptoms with weather or environmental changes or other aggravating or alleviating factors except as outlined above   Current Medications, Allergies, Complete Past Medical History, Past Surgical History, Family History, and Social History were reviewed in [employer] record.  ROS  The following are not active complaints  unless bolded sore throat, dysphagia, dental problems, itching, sneezing,  nasal congestion or excess/ purulent secretions, ear ache,   fever, chills, sweats, unintended wt loss, pleuritic or exertional cp, hemoptysis,  orthopnea pnd or leg swelling, presyncope, palpitations, heartburn, abdominal pain, anorexia, nausea, vomiting, diarrhea  or change in bowel or urinary habits, change in stools or urine now foloy dep, dysuria,hematuria,  rash, arthralgias, visual complaints, headache, numbness weakness or ataxia or problems with walking or coordination,  change in mood/affect or memory.                    Past Medical History:  GERD (ICD-530.81)  HYPERCHOLESTEROLEMIA (ICD-272.0)  PARKINSON'S DISEASE (ICD-332.0)  HYPERLIPIDEMIA, MIXED (ICD-272.2)  HYPERTENSION (ICD-401.9)  HYPERLIPIDEMIA (ICD-272.4)  HEALTH SCREENING (ICD-V70.0)  BENIGN PROSTATIC HYPERTROPHY (ICD-600.00)  Obesity 5/ 8"  - Target wt = 190 for BMI < 30 , 171 for BMI 26  COPD (ICD-496)  - PFT's 11/2005 FEV1 42% with ratio 38% with no improvement after bdilators with DLCO 73%  - PFT's 08/31/09 FEV1 47% with ratio 32% with no better after B2 and DLC0 70  - 09/05/07 overnight pulse ox on ra showed 1:46 min with sat <88  - HFA 25> 50 % October 21, 2008 so changed to neb brov/budesonide  - DPI 100% p coaching June 07, 2010  - Repeat overnight sat ordered June 07, 2009 >> adequate sats on 2lpm (5 min < 89)  - Walking sats  RA = ok x 2 laps March 05, 2010> desats p 2 laps 11/22/2010 > resolved on 2lpm --Complex med regimen, med calendar updated 01/17/2011  .  Family History:  Family History of Cardiovascular disorder  neg resp dz/atopy   Social History:  Former Smoker, quit 1980  Alcohol use-yes  Drug use-no  Retired      Objective:   Physical Exam Chronically ill wm more weak than sob  wt 202 October 21, 2008 > 206 June 07, 2009>   04/09/2011  198 > 10/09/2011  198 > 180 02/08/2013  HEENT mild turbinate edema.  Oropharynx no thrush or excess pnd or cobblestoning. No JVD or cervical adenopathy. Mild accessory muscle hypertrophy. Trachea midline, nl thryroid. Chest was hyperinflated by percussion with diminished breath sounds and marked increased exp time without wheeze. Hoover sign positive onset of inspiration. Regular rate and rhythm without murmur gallop or rub or increase P2.  Trace edema bilaterally.  Decrease s1s2 Abd: no hsm, nl excursion. Ext warm without cyanosis or clubbing      01/31/13 cxr Emphysema without acute disease.    Assessment:

## 2013-02-08 NOTE — Patient Instructions (Addendum)
Stop spiriva since it may be causing constipation and urinary retention.  Continue brovana and budesonide twice daily per neb  Practice with tudorza one twice daily which works just  like spiriva but the dose is one twice daily and doesn't cause the constipation or urinary issues  Ok to use ventolin up to every 4 hours if needed for breathing.  Return here after discharge with all medications in hand to see Tammy NP within a week of discharge

## 2013-02-09 ENCOUNTER — Encounter: Payer: Self-pay | Admitting: Internal Medicine

## 2013-02-09 NOTE — Assessment & Plan Note (Signed)
-   11/22/2010  Walked RA x 2 laps @ 185 ft each stopped due to  desats > resolved on 2lpm    - 04/09/11 Walked on 2lpm x 3 laps with adequate sats    - 10/09/2011  Walked RA x 3 laps @ 185 ft each stopped due to  desat to 87% last lap  Well compensated on 2lpm 24/7 at this point > no change rx

## 2013-02-09 NOTE — Assessment & Plan Note (Signed)
-   PFT's 11/2005 FEV1 42% with ratio 38% with no improvement after bdilators with DLCO 73%  - PFT's 08/31/09 FEV1 47% with ratio 32% with no better after B2 and DLC0 70  - HFA 25> 50 % October 21, 2008 so changed to neb brov/budesonide  - DPI 100% p coaching June 07, 2010  - Referred to Rehab 11/22/2010  And again 10/09/2011   He is now w/c bound due to weakness, not sob, with significant geriatric, neurologic deterioration, not limiting sob  His most recent problems are most likely made worse by use of spiriva with sign systemic anticholinergic side effects > try off  Trial of tudorza, which is LAMA that has little to no systeimic anticholinergic effects  The proper method of use, as well as anticipated side effects, of a metered-dose inhaler are discussed and demonstrated to the patient. Improved effectiveness after extensive coaching during this visit to a level of approximately  0% so keep trying to activate but if can't do it rec  stop the lama's altogether and would not even use sama at this point

## 2013-02-10 ENCOUNTER — Telehealth: Payer: Self-pay | Admitting: Internal Medicine

## 2013-02-10 NOTE — Telephone Encounter (Signed)
No there isn't  So it's fine to leave it off completely

## 2013-02-10 NOTE — Clinical Social Work Placement (Signed)
Clinical Social Work Department CLINICAL SOCIAL WORK PLACEMENT NOTE 02/10/2013  Patient:  [name], [name]  Account Number:  [id number] Admit date:  01/31/2013  Clinical Social Worker:  Lavell Luster  Date/time:  02/10/2013 04:11 PM  Clinical Social Work is seeking post-discharge placement for this patient at the following level of care:   SKILLED NURSING   (*CSW will update this form in Epic as items are completed)   02/04/2013  Patient/family provided with [facility] Department of Clinical Social Work's list of facilities offering this level of care within the geographic area requested by the patient (or if unable, by the patient's family).  02/04/2013  Patient/family informed of their freedom to choose among providers that offer the needed level of care, that participate in Medicare, Medicaid or managed care program needed by the patient, have an available bed and are willing to accept the patient.  02/04/2013  Patient/family informed of [facility]' ownership interest in [facility], as well as of the fact that they are under no obligation to receive care at this facility.  PASARR submitted to EDS on 02/04/2013 PASARR number received from EDS on 02/04/2013  FL2 transmitted to all facilities in geographic area requested by pt/family on  02/04/2013 FL2 transmitted to all facilities within larger geographic area on   Patient informed that his/her managed care company has contracts with or will negotiate with  certain facilities, including the following:     Patient/family informed of bed offers received:  02/04/2013 Patient chooses bed at [facility], [place] Physician recommends and patient chooses bed at    [facility] [place] on  02/04/2013 Patient to be transferred to facility by Ambulance  The following physician request were entered in Epic:   Additional Comments: Per MD  patient ready for DC 02/04/13. Patient transported to [facility] 02/04/13 by amublance. Patient, RN, family, and facility notified of DC. DC packet left with patient's shadow chart. RN given number to call report. CSW signing off.   [name], [place], [place], [phone]

## 2013-02-10 NOTE — Clinical Social Work Psychosocial (Signed)
Clinical Social Work Department BRIEF PSYCHOSOCIAL ASSESSMENT 02/10/2013  Patient:  [name], [name]     Account Number:  [id number]     Admit date:  01/31/2013  Clinical Social Worker:  Lavell Luster  Date/Time:  02/04/2013 04:04 PM  Referred by:  Physician  Date Referred:  02/04/2013  Other Referral:   Interview type:  Family Other interview type:   Patient's daughter and wife interviewed for assessment.    PSYCHOSOCIAL DATA Living Status:  WIFE Admitted from facility:   Level of care:   Primary support name:  [name] [phone] Primary support relationship to patient:  SPOUSE Degree of support available:   Support is strong.    CURRENT CONCERNS Current Concerns  Post-Acute Placement   Other Concerns:    SOCIAL WORK ASSESSMENT / PLAN CSW met with patient and family to discuss recommendation for SNF placement. Patient and family agreeable to SNF placement and search process. Family prefers [facility]. Patient is from home with wife and the plan is for patient to return home after SNF stay.   Assessment/plan status:  Psychosocial Support/Ongoing Assessment of Needs Other assessment/ plan:   Complete FL2, Fax out, PASRR.   Information/referral to community resources:   SNF list and CSW contact information left with family.    PATIENT'S/FAMILY'S RESPONSE TO PLAN OF CARE: Patient and family agreeable to SNF search and placement. Family awaits bed offers. CSW will continue to follow.       [name], [place], [place], [phone]

## 2013-02-10 NOTE — Telephone Encounter (Signed)
I spoke with Kathie Rhodes. She reports pt is not able to use the [place]. They have had 4 nurses to work with pt so he can try to get him to get a good deep breathe to inhale this medication. Per Kathie Rhodes he is not able to. Since he can't, he is not get the medication. She is wanting to know if there is an alternative. Please advise MW thanks  No Known Allergies

## 2013-02-10 NOTE — Telephone Encounter (Signed)
I called and made [facility] aware. Nothing further needed

## 2013-02-19 ENCOUNTER — Telehealth: Payer: Self-pay | Admitting: Neurology

## 2013-02-22 ENCOUNTER — Telehealth: Payer: Self-pay | Admitting: Neurology

## 2013-02-22 NOTE — Telephone Encounter (Signed)
I called the patient and I talked with the wife. The patient is in an extended care facility. He has been placed on Depakote recently, I see no contraindication for the use of this drug.

## 2013-02-22 NOTE — Telephone Encounter (Signed)
Patient sched.

## 2013-02-22 NOTE — Telephone Encounter (Signed)
Ms Gaspar Garbe said that Physician([facility]) prescribed patient depakote -10 mg, wants to make sure it does not conflict with his other meds

## 2013-02-23 ENCOUNTER — Ambulatory Visit: Payer: Self-pay | Admitting: Neurology

## 2013-03-01 ENCOUNTER — Telehealth: Payer: Self-pay | Admitting: Family Medicine

## 2013-03-01 NOTE — Telephone Encounter (Signed)
Pt has been d/c from nursing home on Saturday and [facility] is following the pt for nursing and physical therapy. Pt has foley catheter and saw urologist today. Pt will see neurologist on 03-03-13 for parkinson dz.Sherrilyn Rist from [facility] would like to have a order for Child psychotherapist and nurse aid for [employer].

## 2013-03-02 NOTE — Telephone Encounter (Signed)
Verbal order given to Sherrilyn Rist with [facility]

## 2013-03-02 NOTE — Telephone Encounter (Signed)
ok 

## 2013-03-03 ENCOUNTER — Encounter: Payer: Self-pay | Admitting: Neurology

## 2013-03-03 ENCOUNTER — Ambulatory Visit (INDEPENDENT_AMBULATORY_CARE_PROVIDER_SITE_OTHER): Payer: Medicare Other | Admitting: Neurology

## 2013-03-03 VITALS — BP 124/63 | HR 64

## 2013-03-03 DIAGNOSIS — G20 Parkinson's disease: Secondary | ICD-10-CM

## 2013-03-03 DIAGNOSIS — R413 Other amnesia: Secondary | ICD-10-CM

## 2013-03-03 NOTE — Progress Notes (Signed)
Reason for visit: Parkinson's disease  [name] is an 79 y.o. male  History of present illness:  [name] is an 79 year old left-handed white male with a history of Parkinson's disease and a memory disorder. The patient recently was in the hospital for multiple issues. The patient had impaction of stool, urinary retention with mild acute renal failure, and decompensation of his respiratory status. The patient spent some time in an extended care facility, and he had increased confusion and agitation at that time. The patient just recently transitioned back to the home environment. The patient is doing fairly well at this point. The patient has had some improvement in mental status. The patient still requires some assistance with bathing and dressing, but he can feed himself. Even before he went into the hospital, the patient was requiring assistance with his medications, keeping up with appointments, and general supervision. The patient is now walking with a cane. The patient has not had any recent falls. The patient does have occasional hallucinations, Seeing animals in the house. The patient returns to this office for an evaluation.   Past Medical History  Diagnosis Date  . GERD (gastroesophageal reflux disease)   . Pure hypercholesterolemia   . Paralysis agitans   . Mixed hyperlipidemia   . Unspecified essential hypertension   . Hypertrophy of prostate without urinary obstruction and other lower urinary tract symptoms (LUTS)   . Obesity, unspecified   . Chronic airway obstruction, not elsewhere classified   . Hypertension   . REM sleep behavior disorder   . Memory disorder   . Parkinson disease     Past Surgical History  Procedure Laterality Date  . Hemorrhoid surgery    . Tonsillectomy      Family History  Problem Relation Age of Onset  . Coronary artery disease    . Heart attack Brother   . Heart attack Mother     Social history:  reports that he quit smoking  about 34 years ago. His smoking use included Cigarettes. He has a 60 pack-year smoking history. He has never used smokeless tobacco. He reports that he does not drink alcohol or use illicit drugs.   No Known Allergies  Medications:  Current Outpatient Prescriptions on File Prior to Visit  Medication Sig Dispense Refill  . albuterol (PROVENTIL HFA;VENTOLIN HFA) 108 (90 BASE) MCG/ACT inhaler Inhale 2 puffs into the lungs every 4 (four) hours as needed.  1 Inhaler  0  . arformoterol (BROVANA) 15 MCG/2ML NEBU Take 15 mcg by nebulization 2 (two) times daily.      Marland Kitchen aspirin 325 MG tablet Take 325 mg by mouth daily.       . budesonide (PULMICORT) 0.5 MG/2ML nebulizer solution Take 0.5 mg by nebulization 2 (two) times daily.      . carbidopa-levodopa-entacapone (STALEVO) 37.5-150-200 MG per tablet Take 1 tablet by mouth 4 (four) times daily.      . Multiple Vitamin (MULTIVITAMIN) capsule Take 1 capsule by mouth daily.        . nebivolol (BYSTOLIC) 5 MG tablet Take 2.5 mg by mouth daily.       Marland Kitchen omeprazole (PRILOSEC) 20 MG capsule Take 1 capsule (20 mg total) by mouth daily. 30-60 minutes before first meal of the day  100 capsule  3  . polyethylene glycol (MIRALAX / GLYCOLAX) packet Take 17 g by mouth daily.      . tamsulosin (FLOMAX) 0.4 MG CAPS capsule Take 1 capsule (0.4 mg total) by  mouth daily.       No current facility-administered medications on file prior to visit.    ROS:  Out of a complete 14 system review of symptoms, the patient complains only of the following symptoms, and all other reviewed systems are negative.  Hearing loss Moles Shortness of breath, wheezing Constipation Urinary retention, incontinence, impotence Memory loss, weakness, tremor Anxiety, hypersomnolence, decreased energy, disinterest in activities , Hallucinations  Blood pressure 124/63, pulse 64, weight 0 lb (0 kg).  Physical Exam  General: The patient is alert and cooperative at the time of the  examination.  Skin: No significant peripheral edema is noted.   Neurologic Exam  Mental status: The Mini-Mental status examination done today shows a total score of 15/30.   Cranial nerves: Facial symmetry is present. Speech is normal, no aphasia or dysarthria is noted. Extraocular movements are full, with exception of some restriction of superior gaze. Visual fields are full.  Motor: The patient has good strength in all 4 extremities.  Sensory examination: Soft touch sensation is symmetric on the face, arms, and legs.   Coordination: The patient has good finger-nose-finger and heel-to-shin bilaterally. Significant apraxia with the use of the extremities is noted.   Gait and station: The patient requires some assistance with standing. Once up, the patient is able to take good stride, as it turns. Tandem gait was not attempted.  Romberg is negative. No drift is seen.  Reflexes: Deep tendon reflexes are symmetric.   Assessment/Plan:  One. Parkinson's disease  2. Memory disorder  3. Gait disorder  The patient has been in the hospital recently with fecal impaction, urinary obstruction, and decompensation of his respiratory issues. The patient has had a worsening in mental status with this. Approximately one year ago, the patient was scoring 28/30 on the Mini-Mental status examination. Hopefully, this issue will improve over time. The patient will be maintained on his current dose of the Stale[place]. I would not treat the hallucinations unless they result in agitation. The patient will followup in 3 months. The patient will have home health physical and occupational therapy in the home environment.   Marlan Palau MD 03/03/2013 8:21 PM  [facility] [address] [place], [place] [address]  Phone [phone] Fax [phone]

## 2013-03-03 NOTE — Patient Instructions (Signed)

## 2013-03-10 ENCOUNTER — Ambulatory Visit: Payer: Medicare Other | Admitting: Neurology

## 2013-03-15 ENCOUNTER — Ambulatory Visit: Payer: Medicare Other | Admitting: Internal Medicine

## 2013-03-25 ENCOUNTER — Ambulatory Visit: Payer: Medicare Other | Admitting: Adult Health

## 2013-03-25 ENCOUNTER — Ambulatory Visit (INDEPENDENT_AMBULATORY_CARE_PROVIDER_SITE_OTHER): Payer: Medicare Other | Admitting: Family Medicine

## 2013-03-25 ENCOUNTER — Encounter: Payer: Self-pay | Admitting: Family Medicine

## 2013-03-25 VITALS — BP 124/64 | Temp 98.1°F | Wt 176.0 lb

## 2013-03-25 DIAGNOSIS — G20 Parkinson's disease: Secondary | ICD-10-CM

## 2013-03-25 DIAGNOSIS — I10 Essential (primary) hypertension: Secondary | ICD-10-CM

## 2013-03-25 DIAGNOSIS — M6281 Muscle weakness (generalized): Secondary | ICD-10-CM

## 2013-03-25 DIAGNOSIS — J449 Chronic obstructive pulmonary disease, unspecified: Secondary | ICD-10-CM

## 2013-03-25 DIAGNOSIS — J9610 Chronic respiratory failure, unspecified whether with hypoxia or hypercapnia: Secondary | ICD-10-CM

## 2013-03-25 DIAGNOSIS — N400 Enlarged prostate without lower urinary tract symptoms: Secondary | ICD-10-CM

## 2013-03-25 DIAGNOSIS — K219 Gastro-esophageal reflux disease without esophagitis: Secondary | ICD-10-CM

## 2013-03-25 DIAGNOSIS — R413 Other amnesia: Secondary | ICD-10-CM

## 2013-03-25 DIAGNOSIS — R339 Retention of urine, unspecified: Secondary | ICD-10-CM

## 2013-03-25 MED ORDER — OMEPRAZOLE 20 MG PO CPDR: 20.0000 mg | DELAYED_RELEASE_CAPSULE | Freq: Every day | ORAL | Status: DC

## 2013-03-25 MED ORDER — HYDROCHLOROTHIAZIDE 12.5 MG PO TABS: 12.5000 mg | ORAL_TABLET | Freq: Every day | ORAL | Status: AC

## 2013-03-25 NOTE — Progress Notes (Signed)
Pre visit review using our clinic review tool, if applicable. No additional management support is needed unless otherwise documented below in the visit note. 

## 2013-03-25 NOTE — Patient Instructions (Signed)
Hydrochlorothiazide 12.5 mg......... one tablet daily in the morning for fluid retention  Only wear the ankle it socks  Avoid salt  Continue your other medications  Return when necessary  Followup in pulmonology and urology as outlined

## 2013-03-25 NOTE — Progress Notes (Signed)
   Subjective:    Patient ID: [name], male    DOB: [birth year], 79 y.o.   MRN: [id number]  HPI [name] is an 79 year old married male X. smoker with severe COPD who comes in today accompanied by his wife and daughter following a hospitalization from October 12 through the 16th with a flare of his COPD fecal impaction and BPH with outlet obstruction  He currently has a indwelling Foley. He's due to go back to see urology ASAP  He's also on continuous oxygen at 2 L per minute. On 2 L per minute his pulse ox on room air is 97. It was decrease to 1 L and we'll remeasure his pulse ox to be sure it's normal for him which would be above 88  He's now on a daily bowel program to prevent constipation. He does not have bowel and for a week prior to admission. His medications reviewed in detail. He's currently been given Proscar and Flomax by urology no side effects except BP 120/64.   Review of Systems    review of systems negative Objective:   Physical Exam Well-developed well-nourished male no acute distress vital signs stable he is afebrile pulmonary exam shows no audible breath sounds per usual  Pulse ox on 2 L was 97 pulse ox on 1 L was 97 also.  He has 1-2+ peripheral edema however he has extremely tight mid calf stockings       Assessment & Plan:  Constipation relieved by daily stool softeners  BPH with outlet obstruction with indwelling Foley followup by urology  COPD continue O2 at 1 L  Peripheral edema diuretic when necessary

## 2013-03-28 ENCOUNTER — Encounter (HOSPITAL_COMMUNITY): Payer: Self-pay | Admitting: Emergency Medicine

## 2013-03-28 ENCOUNTER — Other Ambulatory Visit: Payer: Self-pay

## 2013-03-28 ENCOUNTER — Inpatient Hospital Stay (HOSPITAL_COMMUNITY)
Admission: EM | Admit: 2013-03-28 | Discharge: 2013-03-31 | DRG: 190 | Disposition: A | Discharge status: Home Health | Payer: Medicare Other | Attending: Internal Medicine | Admitting: Internal Medicine

## 2013-03-28 ENCOUNTER — Emergency Department (HOSPITAL_COMMUNITY): Payer: Medicare Other

## 2013-03-28 DIAGNOSIS — R4182 Altered mental status, unspecified: Secondary | ICD-10-CM

## 2013-03-28 DIAGNOSIS — K219 Gastro-esophageal reflux disease without esophagitis: Secondary | ICD-10-CM

## 2013-03-28 DIAGNOSIS — Z7982 Long term (current) use of aspirin: Secondary | ICD-10-CM

## 2013-03-28 DIAGNOSIS — Z87891 Personal history of nicotine dependence: Secondary | ICD-10-CM

## 2013-03-28 DIAGNOSIS — Z9981 Dependence on supplemental oxygen: Secondary | ICD-10-CM

## 2013-03-28 DIAGNOSIS — R404 Transient alteration of awareness: Secondary | ICD-10-CM | POA: Diagnosis present

## 2013-03-28 DIAGNOSIS — I10 Essential (primary) hypertension: Secondary | ICD-10-CM

## 2013-03-28 DIAGNOSIS — E782 Mixed hyperlipidemia: Secondary | ICD-10-CM | POA: Diagnosis present

## 2013-03-28 DIAGNOSIS — R55 Syncope and collapse: Secondary | ICD-10-CM

## 2013-03-28 DIAGNOSIS — R339 Retention of urine, unspecified: Secondary | ICD-10-CM

## 2013-03-28 DIAGNOSIS — Z79899 Other long term (current) drug therapy: Secondary | ICD-10-CM

## 2013-03-28 DIAGNOSIS — I6350 Cerebral infarction due to unspecified occlusion or stenosis of unspecified cerebral artery: Secondary | ICD-10-CM

## 2013-03-28 DIAGNOSIS — F29 Unspecified psychosis not due to a substance or known physiological condition: Secondary | ICD-10-CM

## 2013-03-28 DIAGNOSIS — R413 Other amnesia: Secondary | ICD-10-CM

## 2013-03-28 DIAGNOSIS — J189 Pneumonia, unspecified organism: Secondary | ICD-10-CM

## 2013-03-28 DIAGNOSIS — Z6825 Body mass index (BMI) 25.0-25.9, adult: Secondary | ICD-10-CM

## 2013-03-28 DIAGNOSIS — J9600 Acute respiratory failure, unspecified whether with hypoxia or hypercapnia: Secondary | ICD-10-CM

## 2013-03-28 DIAGNOSIS — N390 Urinary tract infection, site not specified: Secondary | ICD-10-CM

## 2013-03-28 DIAGNOSIS — G2581 Restless legs syndrome: Secondary | ICD-10-CM

## 2013-03-28 DIAGNOSIS — J9610 Chronic respiratory failure, unspecified whether with hypoxia or hypercapnia: Secondary | ICD-10-CM

## 2013-03-28 DIAGNOSIS — D045 Carcinoma in situ of skin of trunk: Secondary | ICD-10-CM

## 2013-03-28 DIAGNOSIS — R410 Disorientation, unspecified: Secondary | ICD-10-CM

## 2013-03-28 DIAGNOSIS — R259 Unspecified abnormal involuntary movements: Secondary | ICD-10-CM

## 2013-03-28 DIAGNOSIS — J441 Chronic obstructive pulmonary disease with (acute) exacerbation: Principal | ICD-10-CM

## 2013-03-28 DIAGNOSIS — E669 Obesity, unspecified: Secondary | ICD-10-CM | POA: Diagnosis present

## 2013-03-28 DIAGNOSIS — E7800 Pure hypercholesterolemia, unspecified: Secondary | ICD-10-CM

## 2013-03-28 DIAGNOSIS — Z8249 Family history of ischemic heart disease and other diseases of the circulatory system: Secondary | ICD-10-CM

## 2013-03-28 DIAGNOSIS — G20 Parkinson's disease: Secondary | ICD-10-CM

## 2013-03-28 DIAGNOSIS — R000 Tachycardia, unspecified: Secondary | ICD-10-CM

## 2013-03-28 DIAGNOSIS — J9620 Acute and chronic respiratory failure, unspecified whether with hypoxia or hypercapnia: Secondary | ICD-10-CM

## 2013-03-28 DIAGNOSIS — F0390 Unspecified dementia without behavioral disturbance: Secondary | ICD-10-CM | POA: Diagnosis present

## 2013-03-28 DIAGNOSIS — J449 Chronic obstructive pulmonary disease, unspecified: Secondary | ICD-10-CM

## 2013-03-28 DIAGNOSIS — G20A1 Parkinson's disease without dyskinesia, without mention of fluctuations: Secondary | ICD-10-CM

## 2013-03-28 DIAGNOSIS — N400 Enlarged prostate without lower urinary tract symptoms: Secondary | ICD-10-CM

## 2013-03-28 DIAGNOSIS — C44519 Basal cell carcinoma of skin of other part of trunk: Secondary | ICD-10-CM

## 2013-03-28 DIAGNOSIS — K5900 Constipation, unspecified: Secondary | ICD-10-CM

## 2013-03-28 LAB — CBC
HCT: 42 % (ref 39.0–52.0)
HCT: 43.2 % (ref 39.0–52.0)
Hemoglobin: 13.7 g/dL (ref 13.0–17.0)
Hemoglobin: 14.2 g/dL (ref 13.0–17.0)
MCH: 32.5 pg (ref 26.0–34.0)
MCH: 32.7 pg (ref 26.0–34.0)
MCHC: 32.9 g/dL (ref 30.0–36.0)
MCV: 99.5 fL (ref 78.0–100.0)
MCV: 99.5 fL (ref 78.0–100.0)
Platelets: 134 10*3/uL — ABNORMAL LOW (ref 150–400)
RBC: 4.22 MIL/uL (ref 4.22–5.81)
RDW: 14.1 % (ref 11.5–15.5)

## 2013-03-28 LAB — URINALYSIS, ROUTINE W REFLEX MICROSCOPIC
Ketones, ur: NEGATIVE mg/dL
Nitrite: POSITIVE — AB
Protein, ur: NEGATIVE mg/dL
Specific Gravity, Urine: 1.013 (ref 1.005–1.030)
Urobilinogen, UA: 0.2 mg/dL (ref 0.0–1.0)

## 2013-03-28 LAB — POCT I-STAT 3, ART BLOOD GAS (G3+)
Acid-Base Excess: 8 mmol/L — ABNORMAL HIGH (ref 0.0–2.0)
O2 Saturation: 95 %
Patient temperature: 98.6
pH, Arterial: 7.319 — ABNORMAL LOW (ref 7.350–7.450)
pO2, Arterial: 84 mmHg (ref 80.0–100.0)

## 2013-03-28 LAB — COMPREHENSIVE METABOLIC PANEL
AST: 13 U/L (ref 0–37)
BUN: 15 mg/dL (ref 6–23)
CO2: 37 mEq/L — ABNORMAL HIGH (ref 19–32)
Calcium: 9.2 mg/dL (ref 8.4–10.5)
Creatinine, Ser: 0.82 mg/dL (ref 0.50–1.35)
GFR calc Af Amer: >90 mL/min (ref >90)
GFR calc non Af Amer: 80 mL/min — ABNORMAL LOW (ref >90)
Glucose, Bld: 125 mg/dL — ABNORMAL HIGH (ref 70–99)
Sodium: 142 mEq/L (ref 135–145)
Total Bilirubin: 0.4 mg/dL (ref 0.3–1.2)

## 2013-03-28 LAB — BLOOD GAS, ARTERIAL
Acid-Base Excess: 8.5 mmol/L — ABNORMAL HIGH (ref 0.0–2.0)
Delivery systems: POSITIVE
Drawn by: 225631
FIO2: 0.4 %
Inspiratory PAP: 12
Mode: POSITIVE
O2 Saturation: 97.6 %
Patient temperature: 98.6
TCO2: 34.8 mmol/L (ref 0–100)
pO2, Arterial: 82.3 mmHg (ref 80.0–100.0)

## 2013-03-28 LAB — CREATININE, SERUM
Creatinine, Ser: 0.76 mg/dL (ref 0.50–1.35)
GFR calc non Af Amer: 82 mL/min — ABNORMAL LOW (ref >90)

## 2013-03-28 LAB — URINE MICROSCOPIC-ADD ON

## 2013-03-28 LAB — AMMONIA: Ammonia: 38 umol/L (ref 11–60)

## 2013-03-28 LAB — TSH: TSH: 2.198 u[IU]/mL (ref 0.350–4.500)

## 2013-03-28 LAB — VITAMIN B12: Vitamin B-12: 899 pg/mL (ref 211–911)

## 2013-03-28 LAB — TROPONIN I: Troponin I: <0.3 ng/mL (ref <0.30)

## 2013-03-28 MED ORDER — ONDANSETRON HCL 4 MG PO TABS: 4.0000 mg | ORAL_TABLET | Freq: Four times a day (QID) | ORAL | Status: DC | PRN

## 2013-03-28 MED ORDER — ONDANSETRON HCL 4 MG/2ML IJ SOLN: 4.0000 mg | Freq: Four times a day (QID) | INTRAMUSCULAR | Status: DC | PRN

## 2013-03-28 MED ORDER — CARBIDOPA-LEVODOPA-ENTACAPONE 37.5-150-200 MG PO TABS: 1.0000 | ORAL_TABLET | Freq: Four times a day (QID) | ORAL | Status: DC

## 2013-03-28 MED ORDER — IPRATROPIUM BROMIDE 0.02 % IN SOLN
0.5000 mg | Freq: Once | RESPIRATORY_TRACT | Status: AC
  Administered 2013-03-28: 0.5 mg via RESPIRATORY_TRACT
  Filled 2013-03-28: qty 2.5

## 2013-03-28 MED ORDER — ACETAMINOPHEN 325 MG PO TABS: 650.0000 mg | ORAL_TABLET | Freq: Four times a day (QID) | ORAL | Status: DC | PRN

## 2013-03-28 MED ORDER — SODIUM CHLORIDE 0.9 % IJ SOLN
3.0000 mL | Freq: Two times a day (BID) | INTRAMUSCULAR | Status: DC
  Administered 2013-03-29: 3 mL via INTRAVENOUS

## 2013-03-28 MED ORDER — ALBUTEROL SULFATE HFA 108 (90 BASE) MCG/ACT IN AERS
2.0000 | INHALATION_SPRAY | RESPIRATORY_TRACT | Status: DC | PRN
  Filled 2013-03-28: qty 6.7

## 2013-03-28 MED ORDER — DOCUSATE SODIUM 100 MG PO CAPS
100.0000 mg | ORAL_CAPSULE | Freq: Two times a day (BID) | ORAL | Status: DC
  Administered 2013-03-28 – 2013-03-31 (×6): 100 mg via ORAL
  Filled 2013-03-28 (×7): qty 1

## 2013-03-28 MED ORDER — DEXTROSE 5 % IV SOLN
1.0000 g | Freq: Once | INTRAVENOUS | Status: AC
  Administered 2013-03-28: 1 g via INTRAVENOUS
  Filled 2013-03-28: qty 10

## 2013-03-28 MED ORDER — METHYLPREDNISOLONE SODIUM SUCC 125 MG IJ SOLR
125.0000 mg | Freq: Once | INTRAMUSCULAR | Status: AC
  Administered 2013-03-28: 125 mg via INTRAVENOUS
  Filled 2013-03-28: qty 2

## 2013-03-28 MED ORDER — ARFORMOTEROL TARTRATE 15 MCG/2ML IN NEBU
15.0000 ug | INHALATION_SOLUTION | Freq: Two times a day (BID) | RESPIRATORY_TRACT | Status: DC
  Administered 2013-03-28 – 2013-03-31 (×5): 15 ug via RESPIRATORY_TRACT
  Filled 2013-03-28 (×9): qty 2

## 2013-03-28 MED ORDER — CARBIDOPA-LEVODOPA 25-100 MG PO TABS
1.5000 | ORAL_TABLET | Freq: Four times a day (QID) | ORAL | Status: DC
  Administered 2013-03-29 – 2013-03-31 (×9): 1.5 via ORAL
  Filled 2013-03-28 (×13): qty 1.5

## 2013-03-28 MED ORDER — NEBIVOLOL HCL 2.5 MG PO TABS
2.5000 mg | ORAL_TABLET | Freq: Every day | ORAL | Status: DC
  Administered 2013-03-29 – 2013-03-31 (×3): 2.5 mg via ORAL
  Filled 2013-03-28 (×4): qty 1

## 2013-03-28 MED ORDER — MORPHINE SULFATE 2 MG/ML IJ SOLN: 2.0000 mg | INTRAMUSCULAR | Status: DC | PRN

## 2013-03-28 MED ORDER — ENTACAPONE 200 MG PO TABS
200.0000 mg | ORAL_TABLET | Freq: Four times a day (QID) | ORAL | Status: DC
  Administered 2013-03-29 – 2013-03-31 (×9): 200 mg via ORAL
  Filled 2013-03-28 (×13): qty 1

## 2013-03-28 MED ORDER — SODIUM CHLORIDE 0.9 % IV SOLN
INTRAVENOUS | Status: DC
  Administered 2013-03-28 – 2013-03-30 (×3): via INTRAVENOUS

## 2013-03-28 MED ORDER — HYDROCHLOROTHIAZIDE 12.5 MG PO CAPS
12.5000 mg | ORAL_CAPSULE | Freq: Every day | ORAL | Status: DC
  Administered 2013-03-28 – 2013-03-31 (×4): 12.5 mg via ORAL
  Filled 2013-03-28 (×4): qty 1

## 2013-03-28 MED ORDER — SENNA 8.6 MG PO TABS
1.0000 | ORAL_TABLET | Freq: Every day | ORAL | Status: DC | PRN
  Filled 2013-03-28: qty 1

## 2013-03-28 MED ORDER — DEXTROSE 5 % IV SOLN
1.0000 g | INTRAVENOUS | Status: DC
  Administered 2013-03-29 – 2013-03-30 (×2): 1 g via INTRAVENOUS
  Filled 2013-03-28 (×2): qty 10

## 2013-03-28 MED ORDER — HYDROCODONE-ACETAMINOPHEN 5-325 MG PO TABS
1.0000 | ORAL_TABLET | ORAL | Status: DC | PRN
Start: 2013-03-28 — End: 2013-03-31

## 2013-03-28 MED ORDER — PANTOPRAZOLE SODIUM 40 MG PO TBEC
40.0000 mg | DELAYED_RELEASE_TABLET | Freq: Every day | ORAL | Status: DC
  Administered 2013-03-29 – 2013-03-30 (×2): 40 mg via ORAL
  Filled 2013-03-28 (×2): qty 1

## 2013-03-28 MED ORDER — HYDROCHLOROTHIAZIDE 25 MG PO TABS: 12.5000 mg | ORAL_TABLET | Freq: Every day | ORAL | Status: DC

## 2013-03-28 MED ORDER — TAMSULOSIN HCL 0.4 MG PO CAPS
0.4000 mg | ORAL_CAPSULE | Freq: Every day | ORAL | Status: DC
Start: 2013-03-28 — End: 2013-03-31
  Administered 2013-03-28 – 2013-03-31 (×4): 0.4 mg via ORAL
  Filled 2013-03-28 (×4): qty 1

## 2013-03-28 MED ORDER — ENOXAPARIN SODIUM 40 MG/0.4ML ~~LOC~~ SOLN
40.0000 mg | SUBCUTANEOUS | Status: DC
  Administered 2013-03-28 – 2013-03-31 (×3): 40 mg via SUBCUTANEOUS
  Filled 2013-03-28 (×4): qty 0.4

## 2013-03-28 MED ORDER — ASPIRIN 325 MG PO TABS
325.0000 mg | ORAL_TABLET | Freq: Every day | ORAL | Status: DC
  Administered 2013-03-28 – 2013-03-31 (×4): 325 mg via ORAL
  Filled 2013-03-28 (×4): qty 1

## 2013-03-28 MED ORDER — LEVOFLOXACIN IN D5W 750 MG/150ML IV SOLN
750.0000 mg | Freq: Once | INTRAVENOUS | Status: AC
  Administered 2013-03-28: 750 mg via INTRAVENOUS
  Filled 2013-03-28: qty 150

## 2013-03-28 MED ORDER — BUDESONIDE 0.5 MG/2ML IN SUSP
0.5000 mg | Freq: Two times a day (BID) | RESPIRATORY_TRACT | Status: DC
  Administered 2013-03-28 – 2013-03-31 (×7): 0.5 mg via RESPIRATORY_TRACT
  Filled 2013-03-28 (×8): qty 2

## 2013-03-28 MED ORDER — FINASTERIDE 5 MG PO TABS
5.0000 mg | ORAL_TABLET | Freq: Every day | ORAL | Status: DC
Start: 2013-03-28 — End: 2013-03-31
  Administered 2013-03-28 – 2013-03-31 (×5): 5 mg via ORAL
  Filled 2013-03-28 (×4): qty 1

## 2013-03-28 MED ORDER — SENNA 8.6 MG PO TABS
1.0000 | ORAL_TABLET | Freq: Two times a day (BID) | ORAL | Status: DC
  Administered 2013-03-28 – 2013-03-31 (×6): 8.6 mg via ORAL
  Filled 2013-03-28 (×8): qty 1

## 2013-03-28 MED ORDER — POLYETHYLENE GLYCOL 3350 17 G PO PACK
17.0000 g | PACK | Freq: Every day | ORAL | Status: DC
  Administered 2013-03-29 – 2013-03-31 (×3): 17 g via ORAL
  Filled 2013-03-28 (×5): qty 1

## 2013-03-28 MED ORDER — ALBUTEROL SULFATE (5 MG/ML) 0.5% IN NEBU
5.0000 mg | INHALATION_SOLUTION | Freq: Once | RESPIRATORY_TRACT | Status: AC
  Administered 2013-03-28: 5 mg via RESPIRATORY_TRACT
  Filled 2013-03-28: qty 1

## 2013-03-28 MED ORDER — ACETAMINOPHEN 650 MG RE SUPP: 650.0000 mg | Freq: Four times a day (QID) | RECTAL | Status: DC | PRN

## 2013-03-28 MED ORDER — PNEUMOCOCCAL VAC POLYVALENT 25 MCG/0.5ML IJ INJ: 0.5000 mL | INJECTION | INTRAMUSCULAR | Status: DC | PRN

## 2013-03-28 NOTE — H&P (Addendum)
[facility] History and Physical  [name] WUJ:[id number] DOB: [birth year] DOA: 03/28/2013  Referring physician:  PCP: Arthur Georges, MD  Specialists:   Chief Complaint: Altered mental status, passing out  HPI: [name] is a 79 y.o. male  With a history of COPD requiring 2 L of oxygen, BPH with indwelling Foley, hypertension, Parkinson's disease that presents emergency department for altered mental status as well as passing out. Patient is accompanied by his wife and a children. Patient's wife stated that the patient had gone to the bathroom this morning upon returning to the bed he passed on the bed. She also states he's been somewhat more confused lately. She does state that with the Parkinson's he is confused however it has been increasing recently. Patient was scheduled to have an appointment with Dr. Shona [name] his urologist on 03/29/2013. Per the wife the patient has not had any complaints of any chest pain, cough, abdominal pain, nausea or vomiting, dizziness or headache. She does state that his breathing has been somewhat labored lately. She also states that she does not know whether patient wore his oxygen last night. The patient does complain of some shortness of breath at this time.  Review of Systems:  Constitutional: Denies fever, chills, diaphoresis, appetite change and fatigue.  HEENT: Denies photophobia, eye pain, redness, hearing loss, ear pain, congestion, sore throat, rhinorrhea, sneezing, mouth sores, trouble swallowing, neck pain, neck stiffness and tinnitus.   Respiratory: Denies DOE, cough, chest tightness,  and wheezing.   Cardiovascular: Denies chest pain, palpitations and leg swelling.  Gastrointestinal: Denies nausea, vomiting, abdominal pain, diarrhea, constipation, blood in stool and abdominal distention.  Genitourinary: Denies dysuria, urgency, frequency, hematuria, flank pain and difficulty urinating.  has indwelling Foley. Musculoskeletal: Denies  myalgias, back pain, joint swelling, arthralgias and gait problem.  Skin: Denies pallor, rash and wound.  Neurological: Denies dizziness, seizures, syncope, weakness, light-headedness, numbness and headaches.  Hematological: Denies adenopathy. Easy bruising, personal or family bleeding history  Psychiatric/Behavioral: Denies suicidal ideation, mood changes, confusion, nervousness, sleep disturbance and agitation  Past Medical History  Diagnosis Date  . GERD (gastroesophageal reflux disease)   . Pure hypercholesterolemia   . Paralysis agitans   . Mixed hyperlipidemia   . Unspecified essential hypertension   . Hypertrophy of prostate without urinary obstruction and other lower urinary tract symptoms (LUTS)   . Obesity, unspecified   . Chronic airway obstruction, not elsewhere classified   . Hypertension   . REM sleep behavior disorder   . Memory disorder   . Parkinson disease    Past Surgical History  Procedure Laterality Date  . Hemorrhoid surgery    . Tonsillectomy     Social History:  reports that he quit smoking about 34 years ago. His smoking use included Cigarettes. He has a 60 pack-year smoking history. He has never used smokeless tobacco. He reports that he does not drink alcohol or use illicit drugs. Was at home with his wife.  No Known Allergies  Family History  Problem Relation Age of Onset  . Coronary artery disease    . Heart attack Brother   . Heart attack Mother     Prior to Admission medications   Medication Sig Start Date End Date Taking? Authorizing Provider  arformoterol (BROVANA) 15 MCG/2ML NEBU Take 15 mcg by nebulization 2 (two) times daily.   Yes Historical Provider, MD  aspirin 325 MG tablet Take 325 mg by mouth daily.    Yes Historical Provider, MD  budesonide (PULMICORT)  0.5 MG/2ML nebulizer solution Take 0.5 mg by nebulization 2 (two) times daily.   Yes Historical Provider, MD  carbidopa-levodopa-entacapone (STALEVO) 37.5-150-200 MG per tablet Take  1 tablet by mouth 4 (four) times daily.   Yes Historical Provider, MD  finasteride (PROSCAR) 5 MG tablet Take 5 mg by mouth daily. 03/02/13  Yes Historical Provider, MD  hydrochlorothiazide (HYDRODIURIL) 12.5 MG tablet Take 1 tablet (12.5 mg total) by mouth daily. 03/25/13  Yes Arthur Pee, MD  magnesium hydroxide (MILK OF MAGNESIA) 400 MG/5ML suspension Take 30 mLs by mouth every Wednesday.   Yes Historical Provider, MD  Multiple Vitamin (MULTIVITAMIN) capsule Take 1 capsule by mouth daily.     Yes Historical Provider, MD  nebivolol (BYSTOLIC) 5 MG tablet Take 2.5 mg by mouth daily.    Yes Historical Provider, MD  omeprazole (PRILOSEC) 20 MG capsule Take 1 capsule (20 mg total) by mouth daily. 30-60 minutes before first meal of the day 03/25/13  Yes Arthur Pee, MD  polyethylene glycol [facility] / GLYCOLAX) packet Take 17 g by mouth daily. 02/04/13  Yes Arthur Llano, MD  senna (SENOKOT) 8.6 MG TABS tablet Take 1 tablet by mouth daily as needed for mild constipation.   Yes Historical Provider, MD  tamsulosin (FLOMAX) 0.4 MG CAPS capsule Take 1 capsule (0.4 mg total) by mouth daily. 02/04/13  Yes Arthur Llano, MD  albuterol (PROVENTIL HFA;VENTOLIN HFA) 108 (90 BASE) MCG/ACT inhaler Inhale 2 puffs into the lungs every 4 (four) hours as needed. 12/11/12   Arthur Cowden, MD   Physical Exam: Filed Vitals:   03/28/13 0857  BP: 142/62  Pulse: 69  Temp:   Resp: 15     General: Well developed, well nourished, NAD, appears stated age  HEENT: NCAT, PERRLA, EOMI, Anicteic Sclera, mucous membranes moist. No pharyngeal erythema or exudates  Neck: Supple, no JVD, no masses  Cardiovascular: S1 S2 auscultated, no rubs, murmurs or gallops. Regular rate and rhythm.  Respiratory: Clear to auscultation bilaterally with equal chest rise  Abdomen: Soft, nontender, nondistended, + bowel sounds  Extremities: warm dry without cyanosis clubbing, +1 pitting edema in lower extremities bilaterally  Neuro:  Awake and alert, pleasantly confused, cranial nerves grossly intact. Strength 5/5 in patient's upper and lower extremities bilaterally  Skin: Without rashes exudates or nodules  Psych: Normal affect and demeanor with intact judgement and insight  Labs on Admission:  Basic Metabolic Panel:  Recent Labs Lab 03/28/13 0724  NA 142  K 3.8  CL 100  CO2 37*  GLUCOSE 125*  BUN 15  CREATININE 0.82  CALCIUM 9.2   Liver Function Tests:  Recent Labs Lab 03/28/13 0724  AST 13  ALT <5  ALKPHOS 79  BILITOT 0.4  PROT 5.8*  ALBUMIN 3.4*   No results found for this basename: LIPASE, AMYLASE,  in the last 168 hours No results found for this basename: AMMONIA,  in the last 168 hours CBC:  Recent Labs Lab 03/28/13 0724  WBC 7.1  HGB 13.7  HCT 42.0  MCV 99.5  PLT 134*   Cardiac Enzymes:  Recent Labs Lab 03/28/13 0724  TROPONINI <0.30    BNP (last 3 results)  Recent Labs  01/31/13 1900  PROBNP 872.0*   CBG: No results found for this basename: GLUCAP,  in the last 168 hours  Radiological Exams on Admission: Ct Head Wo Contrast  03/28/2013   CLINICAL DATA:  Altered mental status and syncope, no history of recent trauma  EXAM: CT  HEAD WITHOUT CONTRAST  TECHNIQUE: Contiguous axial images were obtained from the base of the skull through the vertex without intravenous contrast.  COMPARISON:  None.  FINDINGS: There is mild diffuse cerebral atrophy with compensatory ventriculomegaly. There is no shift of the midline. There is no evidence of an acute intracranial hemorrhage nor of an evolving ischemic infarction. There are no abnormal intracranial calcifications. The cerebellum and brainstem are normal in density.  At bone window settings the observed portions of the paranasal sinuses and mastoid air cells are clear. There is no evidence of an acute skull fracture.  IMPRESSION: 1. There is no evidence of an acute ischemic or hemorrhagic infarction. 2. There is no intracranial mass  effect or hydrocephalus. 3. There is mild age appropriate diffuse cerebral atrophy with mild compensatory ventriculomegaly. These findings appear stable since an MRI of the brain of October 2007.   Electronically Signed   By: David  [place]   On: 03/28/2013 08:44   Dg Chest Portable 1 View  03/28/2013   CLINICAL DATA:  Shortness of Breath  EXAM: PORTABLE CHEST - 1 VIEW  COMPARISON:  01/31/2013  FINDINGS: Cardiomediastinal silhouette is stable. Hyperinflation and chronic mild interstitial prominence again noted. Stable left basilar atelectasis or scarring. Degenerative changes bilateral shoulders again noted. No segmental infiltrate.  IMPRESSION: Hyperinflation and chronic interstitial prominence. Stable left basilar atelectasis or scarring. Degenerative changes bilateral shoulders.   Electronically Signed   By: Natasha Mead M.D.   On: 03/28/2013 09:13    EKG: Independently reviewed. Sinus rhythm, rate of 74, normal axis, borderline T wave abnormalities  Assessment/Plan Principal Problem:   Acute respiratory failure Active Problems:   PARKINSON'S DISEASE   HYPERTENSION   COPD GOLD III   BENIGN PROSTATIC HYPERTROPHY   Altered mental status   UTI (lower urinary tract infection)   Syncopal episodes   Acute hypercarbic respiratory failure Patient does have a history of COPD. At this time we will place him on BiPAP and will admit him to the step down unit. Will also continue nebulizer treatments. His CO2 on ABG was noted to be 72. He does not have baseline hypertension, his last CO2 on ABG was noted in the 40s. Patient does use oxygen at night. However wife does not know whether he used it last night.  Chest x-ray did not show any type of infiltrate or infection. Patient remained afebrile and no leukocytosis. Will continue to monitor him, will obtain repeat ABG at approximately 2 PM today.  Urinary tract infection with history of indwelling Foley catheter Patient does have too numerous to count white  blood cells noted on UA. Will obtain urine culture. Will also place patient on ceftriaxone. Patient's last urine culture in October 2014 did not show any growth. Patient was due to see his urologist tomorrow Monday, 03/29/2013. Will contact urology for possible replacement of Foley catheter.  Metabolic encephalopathy Possibly secondary to UTI as well as hypercarbic respiratory failure. Will continue to monitor patient.  Will obtain ammonia, TSH, vitamin B12 level. Will also conduct neuro checks every 4 hours.  Syncopal episode Per wife patient passed on the bed after returning from the restroom. At this time will place patient on neurochecks every 4 hours, will place patient on telemetry to monitor for any bradycardia or tachyarrhythmias. CT of the head did not show any intracranial abnormalities.  Parkinson syndrome Will continue patient on his home medications of carbidopa levodopa and entacapone.  Hypertension Will continue patient on his home medications of HCTZ as  well as diastolic.. Will add on taking medication if needed.  BPH requiring indwelling Foley catheter Will continue patient on his Proscar as well as Flomax. Will also contact urology.  GERD Will continue PPI  COPD  Will continue nebulizer treatments  DVT prophylaxis: Lovenox  Code Status: Full  Condition: Guarded  Family Communication: Wife and family at bedside. Admission, patients condition and plan of care including tests being ordered have been discussed with the patient and family who indicate understanding and agree with the plan and Code Status.  Disposition Plan: Admitted, expected length of stay approximately 2-3 midnights..  Time spent: 50 minutes  Oluwatobiloba Martin D.O. [facility] Pager [phone]  If 7PM-7AM, please contact night-coverage [email or web address] Password [email or web address] 03/28/2013, 10:13 AM

## 2013-03-28 NOTE — Progress Notes (Signed)
Pt transported from ED to 3S07 on Bipap. No complications noted.

## 2013-03-28 NOTE — ED Notes (Signed)
Per EMS, patient got out of bed to use restroom, but patient has catheter in place. According to family, patient has been having episodes of confusion. Patient then remembered he had catheter in and and walked back to bed and had a syncopal episode in bed. Family reports it lasted about 5 minutes. When patient awoke, he was no longer confused and did not remember event. BP 185/90, CBG 132, 95% on 2L.

## 2013-03-28 NOTE — ED Provider Notes (Signed)
CSN: [id number]     Arrival date & time 03/28/13  2841 History   First MD Initiated Contact with Patient 03/28/13 [phone]     Chief Complaint  Patient presents with  . Loss of Consciousness   (Consider location/radiation/quality/duration/timing/severity/associated sxs/prior Treatment) HPI A LEVEL 5 CAVEAT PERTAINS DUE TO ALTERED MENTAL STATUS Pt presents after syncopal episode this morning.  Per family members patient got up to use the bathroom this morning- however he does use an indwelling foley catheter.  He appeared to lose his balance and fell back onto the bed (family states he has poor balance at baseline), then wife felt he had LOC.  He was unresponsive for a brief time.  Did not strike head.  Family states that now he is at his recent baseline.  He has been having some confusion since hospitalization for SBO in October.  Pt does not remember the event.    Past Medical History  Diagnosis Date  . GERD (gastroesophageal reflux disease)   . Pure hypercholesterolemia   . Paralysis agitans   . Mixed hyperlipidemia   . Unspecified essential hypertension   . Hypertrophy of prostate without urinary obstruction and other lower urinary tract symptoms (LUTS)   . Obesity, unspecified   . Chronic airway obstruction, not elsewhere classified   . Hypertension   . REM sleep behavior disorder   . Memory disorder   . Parkinson disease    Past Surgical History  Procedure Laterality Date  . Hemorrhoid surgery    . Tonsillectomy     Family History  Problem Relation Age of Onset  . Coronary artery disease    . Heart attack Brother   . Heart attack Mother    History  Substance Use Topics  . Smoking status: Former Smoker -- 3.00 packs/day for 20 years    Types: Cigarettes    Quit date: 04/22/1978  . Smokeless tobacco: Never Used  . Alcohol Use: No    Review of Systems UNABLE TO OBTAIN ROS DUE TO LEVEL 5 CAVEAT  Allergies  Review of patient's allergies indicates no known  allergies.  Home Medications   Current Outpatient Rx  Name  Route  Sig  Dispense  Refill  . albuterol (PROVENTIL HFA;VENTOLIN HFA) 108 (90 BASE) MCG/ACT inhaler   Inhalation   Inhale 2 puffs into the lungs every 4 (four) hours as needed.   1 Inhaler   0   . arformoterol (BROVANA) 15 MCG/2ML NEBU   Nebulization   Take 15 mcg by nebulization 2 (two) times daily.         Marland Kitchen aspirin 325 MG tablet   Oral   Take 325 mg by mouth daily.          . budesonide (PULMICORT) 0.5 MG/2ML nebulizer solution   Nebulization   Take 0.5 mg by nebulization 2 (two) times daily.         . carbidopa-levodopa-entacapone (STALEVO) 37.5-150-200 MG per tablet   Oral   Take 1 tablet by mouth 4 (four) times daily.         . finasteride (PROSCAR) 5 MG tablet   Oral   Take 5 mg by mouth daily.         . hydrochlorothiazide (HYDRODIURIL) 12.5 MG tablet   Oral   Take 1 tablet (12.5 mg total) by mouth daily.   90 tablet   3   . Multiple Vitamin (MULTIVITAMIN) capsule   Oral   Take 1 capsule by mouth daily.           Marland Kitchen  nebivolol (BYSTOLIC) 5 MG tablet   Oral   Take 2.5 mg by mouth daily.          Marland Kitchen omeprazole (PRILOSEC) 20 MG capsule   Oral   Take 1 capsule (20 mg total) by mouth daily. 30-60 minutes before first meal of the day   100 capsule   3   . polyethylene glycol (MIRALAX / GLYCOLAX) packet   Oral   Take 17 g by mouth daily.         . tamsulosin (FLOMAX) 0.4 MG CAPS capsule   Oral   Take 1 capsule (0.4 mg total) by mouth daily.          BP 163/76  Pulse 72  Temp(Src) 97.3 F (36.3 C) (Oral)  Resp 19  SpO2 96% Vitals reviewed Physical Exam Physical Examination: General appearance - alert, well appearing, and in no distress Mental status - alert, oriented to person, place, and time Eyes - pupils equal and reactive, extraocular eye movements intact Mouth - mucous membranes moist, pharynx normal without lesions Chest - diffuse wheezes bilaterally, tachypneic,  increased respiratory effort, BSS Heart - normal rate, regular rhythm, normal S1, S2, no murmurs, rubs, clicks or gallops Abdomen - soft, nontender, nondistended, no masses or organomegaly Neurological - alert, oriented, normal speech, no focal findings or movement disorder noted Extremities - peripheral pulses normal, no pedal edema, no clubbing or cyanosis Skin - normal coloration and turgor, no rashes  ED Course  Procedures (including critical care time)  10:01 AM d/w [facility] for admission, pt to go to stepdown bed on bipap.  Family updated at bedside and they are agreeable with the plan.  Labs Review Labs Reviewed  CBC  COMPREHENSIVE METABOLIC PANEL  TROPONIN I  BLOOD GAS, ARTERIAL  URINALYSIS, ROUTINE W REFLEX MICROSCOPIC  POCT CBG (FASTING - GLUCOSE)-MANUAL ENTRY   Imaging Review No results found.  EKG Interpretation    Date/Time:  Sunday March 28 2013 06:47:05 EST Ventricular Rate:  74 PR Interval:  181 QRS Duration: 91 QT Interval:  403 QTC Calculation: 447 R Axis:   68 Text Interpretation:  Sinus rhythm Borderline T abnormalities, anterior leads No significant change since last tracing Confirmed by [facility]  MD, MARTHA [phone]) on 03/28/2013 12:52:12 PM          CRITICAL CARE Performed by: Ethelda Chick Total critical care time: 35 Critical care time was exclusive of separately billable procedures and treating other patients. Critical care was necessary to treat or prevent imminent or life-threatening deterioration. Critical care was time spent personally by me on the following activities: development of treatment plan with patient and/or surrogate as well as nursing, discussions with consultants, evaluation of patient's response to treatment, examination of patient, obtaining history from patient or surrogate, ordering and performing treatments and interventions, ordering and review of laboratory studies, ordering and review of radiographic studies, pulse oximetry  and re-evaluation of patient's condition.  MDM  No diagnosis found. Pt presenting with c/o confusion, sycnopal event and labored breathing.  Pt found to be hypercarbic on ABG- started on bipap.  Also found to have UTI- started on rocephin.  No pneumonia on CXR or signs of fluid overload.  PT treated with nebs, solumedrol and started on abx.  Pt admitted to [facility] for further management.     Ethelda Chick, MD 03/28/13 [phone]

## 2013-03-28 NOTE — Progress Notes (Signed)
ANTIBIOTIC CONSULT NOTE - INITIAL  Pharmacy Consult for ceftriaxone Indication: UTI  No Known Allergies  Patient Measurements:    Body Weight: 79.8 kg  Vital Signs: Temp: 97.3 F (36.3 C) (12/07 1610) Temp src: Oral (12/07 0635) BP: 142/62 mmHg (12/07 0857) Pulse Rate: 69 (12/07 0857) Intake/Output from previous day:   Intake/Output from this shift:    Labs:  Recent Labs  03/28/13 0724  WBC 7.1  HGB 13.7  PLT 134*  CREATININE 0.82   The CrCl is unknown because both a height and weight (above a minimum accepted value) are required for this calculation. No results found for this basename: VANCOTROUGH, VANCOPEAK, VANCORANDOM, GENTTROUGH, GENTPEAK, GENTRANDOM, TOBRATROUGH, TOBRAPEAK, TOBRARND, AMIKACINPEAK, AMIKACINTROU, AMIKACIN,  in the last 72 hours   Microbiology: No results found for this or any previous visit (from the past 720 hour(s)).  Medical History: Past Medical History  Diagnosis Date  . GERD (gastroesophageal reflux disease)   . Pure hypercholesterolemia   . Paralysis agitans   . Mixed hyperlipidemia   . Unspecified essential hypertension   . Hypertrophy of prostate without urinary obstruction and other lower urinary tract symptoms (LUTS)   . Obesity, unspecified   . Chronic airway obstruction, not elsewhere classified   . Hypertension   . REM sleep behavior disorder   . Memory disorder   . Parkinson disease     Medications:  Scheduled:   Assessment:   79 yo man with indwelling foley to start ceftriaxone for UTI. Goal of Therapy:  Eradication of infection  Plan:  Ceftriaxone 1 gm IV q24 hours. Pharmacy will sign off.  PLease advise if we can be of further assistance.  Sabryn Preslar Poteet 03/28/2013,10:18 AM

## 2013-03-28 NOTE — Progress Notes (Signed)
Patient refused to wear Bipap at this time.  Patient is in no distress.  RT will continue to monitor.

## 2013-03-29 ENCOUNTER — Ambulatory Visit: Payer: Medicare Other | Admitting: Adult Health

## 2013-03-29 LAB — BASIC METABOLIC PANEL
BUN: 13 mg/dL (ref 6–23)
CO2: 35 mEq/L — ABNORMAL HIGH (ref 19–32)
Calcium: 9.2 mg/dL (ref 8.4–10.5)
Chloride: 103 mEq/L (ref 96–112)
Creatinine, Ser: 0.84 mg/dL (ref 0.50–1.35)
GFR calc Af Amer: >90 mL/min (ref >90)
GFR calc non Af Amer: 79 mL/min — ABNORMAL LOW (ref >90)
Glucose, Bld: 98 mg/dL (ref 70–99)
Sodium: 142 mEq/L (ref 135–145)

## 2013-03-29 LAB — CBC
MCH: 31.9 pg (ref 26.0–34.0)
Platelets: 144 10*3/uL — ABNORMAL LOW (ref 150–400)
RBC: 4.07 MIL/uL — ABNORMAL LOW (ref 4.22–5.81)
RDW: 13.6 % (ref 11.5–15.5)
WBC: 7.6 10*3/uL (ref 4.0–10.5)

## 2013-03-29 MED ORDER — ALBUTEROL SULFATE (5 MG/ML) 0.5% IN NEBU: 2.5000 mg | INHALATION_SOLUTION | RESPIRATORY_TRACT | Status: DC | PRN

## 2013-03-29 NOTE — Care Management Note (Addendum)
    Page 1 of 2   03/31/2013     3:27:37 PM   CARE MANAGEMENT NOTE 03/31/2013  Patient:  [name], [name]   Account Number:  [id number]  Date Initiated:  03/29/2013  Documentation initiated by:  Donn Pierini  Subjective/Objective Assessment:   Pt admitted with AMS and resp failure- placed on BIPAP     Action/Plan:   PTA pt lived at home- has [facility]  in the home several times a week with HH-PT, home 02 is with [facility]- NCM to follow for d/c needs- pt will need resumption orders at time of discharge for HH-PT   Anticipated DC Date:  03/31/2013   Anticipated DC Plan:  HOME W HOME HEALTH SERVICES      DC Planning Services  CM consult      [facility] Choice  HOME HEALTH   Choice offered to / List presented to:  C-3 Spouse        HH arranged  HH-2 PT  HH-3 OT      [facility] agency  [facility] Care   Status of service:  Completed, signed off Medicare Important Message given?   (If response is "NO", the following Medicare IM given date fields will be blank) Date Medicare IM given:   Date Additional Medicare IM given:    Discharge Disposition:  HOME W HOME HEALTH SERVICES  Per UR Regulation:  Reviewed for med. necessity/level of care/duration of stay  If discussed at Long Length of Stay Meetings, dates discussed:    Comments:  03/31/13 13:32 Letha Cape RN, BSN [phone] patient 's spouse chose [facility] for hhpt/ot she states she did not a HHRN,  NCM tried to convince her to go with [facility] as well, she did not want to do that.  Referral made to [facility] for HHPT/OT , Lupita Leash notified.  Soc will begin 24-48 hrs post discharge.  Per Lupita Leash with [facility] states daughter is in room now and states they are active with [facility], NCM called [facility] and she stated patient is active with them and they are finishing up with [facility], but will add hhpt/ot, NCM will fax paperwork over to them.  03/30/13 14:56 Letha Cape RN, BSN [phone] patient has refused [facility] services within the last 30 days per Anibal Henderson with [facility],  they will give patient sometime before they try offer services again.  Patient has home oxygen, lives with spouse. NCM will continue to follow for dc needs.

## 2013-03-29 NOTE — Progress Notes (Signed)
Per [facility] nurse, while transferring pt, pt got agitated and grabbed her arm in a hard grasp. Pt has been calm and relaxed for me. No complaints at the moment.

## 2013-03-29 NOTE — Progress Notes (Signed)
Pt report called to 5W RN, VSS, wife at bedside, belongs packed and sent with patient to new room.  meds current to time, though patient is refusing most, MD aware.

## 2013-03-29 NOTE — Progress Notes (Signed)
Utilization review completed.  

## 2013-03-29 NOTE — Plan of Care (Signed)
Problem: Phase I Progression Outcomes Goal: Pain controlled with appropriate interventions Outcome: Progressing Patient denies pain at this time will continue to monitor Goal: Voiding-avoid urinary catheter unless indicated Outcome: Not Progressing Pt has a pre admission bladder outlet obstruction, and was sent home with a foley Goal: Hemodynamically stable Outcome: Progressing VSS

## 2013-03-29 NOTE — Progress Notes (Signed)
[facility] Progress Note [place] TEAM 1 - Stepdown/ICU TEAM   [name] ZOX:[id number] DOB: Oct 29, 1926 DOA: 03/28/2013 PCP: Evette Georges, MD  Admit HPI / Brief Narrative: 79 y.o. male with a history of COPD requiring 2 L of oxygen, BPH with indwelling Foley, hypertension, Parkinson's disease who presented to the emergency department for altered mental status as well as passing out. Patient's wife stated that the patient had gone to the bathroom;  upon returning to the bed he passed out onto the bed. She also stated he'd been somewhat more confused lately. She did state that with the Parkinson's he is intermittently confused however it had been increasing recently.   HPI/Subjective: Pt is awake and alert.  He is anxious to be d/c home.  He states that he feels much better.  He denies n/v, abdom pain, sob, f/c, or cp.   Assessment/Plan:  Acute hypercarbic respiratory failure in setting of COPD - chronic O2 dependent  Chest x-ray did not show an infiltrate - ABG much improved - with improved CO2 pt more alert and now refusing BIPAP - cont O2 support - some wheeze on exam so will cont scheduled nebs - if persists may require short term steroid tx   Urinary tract infection with history of indwelling Foley catheter Cont empiric abx tx - Urology reportedly consulted by admitting MD - will need to consider replacement or simply discontinuation of foley cath  BPH requiring indwelling Foley catheter As discussed above  Metabolic encephalopathy CT of the head did not show any intracranial abnormalities - B12 is normal - ammonia level is normal - likely due to hypercarbia due to COPD - appears to have returned to baseline mental status   Syncopal episode Etiology not clear - could have been simple orthostasis/micturition related - no evidence of arrhythmia so will d/c tele - begin PT/OT and follow - further eval likely to provide little benefit to patient   HTN Adjust tx as  BP poorly controlled at present   GERD  Parkinson's disease  Followed by Dr. Anne Hahn at [facility] - cont outpt tx regimen  Code Status: FULL Family Communication: spoke w/ wife and son at bedside  Disposition Plan: transfer to medical bed   Consultants: Urology   Procedures: none  Antibiotics: Rocephin 12/07 >> Levaquin 12/07  DVT prophylaxis: lovenox  Objective: Blood pressure 140/55, pulse 78, temperature 98.3 F (36.8 C), temperature source Oral, resp. rate 28, height 5\' 9"  (1.753 m), weight 77.8 kg (171 lb 8.3 oz), SpO2 97.00%.  Intake/Output Summary (Last 24 hours) at 03/29/13 1220 Last data filed at 03/29/13 0700  Gross per 24 hour  Intake   1545 ml  Output   2400 ml  Net   -855 ml   Exam: General: No acute respiratory distress at rest  Lungs: very distant bs th/o all fields - mild exp wheeze diffusely - no focal crackles  Cardiovascular: Regular rate and rhythm without murmur gallop or rub normal S1 and S2 - distant HS Abdomen: Nontender, nondistended, soft, bowel sounds positive, no rebound, no ascites, no appreciable mass Extremities: No significant cyanosis, clubbing, or edema bilateral lower extremities  Data Reviewed: Basic Metabolic Panel:  Recent Labs Lab 03/28/13 0724 03/28/13 1257 03/29/13 0430  NA 142  --  142  K 3.8  --  3.5  CL 100  --  103  CO2 37*  --  35*  GLUCOSE 125*  --  98  BUN 15  --  13  CREATININE 0.82  0.76 0.84  CALCIUM 9.2  --  9.2   Liver Function Tests:  Recent Labs Lab 03/28/13 0724  AST 13  ALT <5  ALKPHOS 79  BILITOT 0.4  PROT 5.8*  ALBUMIN 3.4*    Recent Labs Lab 03/28/13 1200  AMMONIA 38   CBC:  Recent Labs Lab 03/28/13 0724 03/28/13 1257 03/29/13 0430  WBC 7.1 7.2 7.6  HGB 13.7 14.2 13.0  HCT 42.0 43.2 39.9  MCV 99.5 99.5 98.0  PLT 134* 134* 144*   Cardiac Enzymes:  Recent Labs Lab 03/28/13 0724  TROPONINI <0.30   BNP (last 3 results)  Recent Labs  01/31/13 1900  PROBNP 872.0*     Studies:  Recent x-ray studies have been reviewed in detail by the Attending Physician  Scheduled Meds:  Scheduled Meds: . arformoterol  15 mcg Nebulization BID  . aspirin  325 mg Oral Daily  . budesonide  0.5 mg Nebulization BID  . carbidopa-levodopa  1.5 tablet Oral QID  . cefTRIAXone (ROCEPHIN)  IV  1 g Intravenous Q24H  . docusate sodium  100 mg Oral BID  . enoxaparin (LOVENOX) injection  40 mg Subcutaneous Q24H  . entacapone  200 mg Oral QID  . finasteride  5 mg Oral Daily  . hydrochlorothiazide  12.5 mg Oral Daily  . nebivolol  2.5 mg Oral Daily  . pantoprazole  40 mg Oral QAC breakfast  . polyethylene glycol  17 g Oral Daily  . senna  1 tablet Oral BID  . sodium chloride  3 mL Intravenous Q12H  . tamsulosin  0.4 mg Oral Daily    Time spent on care of this patient: 35 mins   [facility]  [facility] Office  [phone] Pager - Text Page per Loretha Stapler as per below:  On-Call/Text Page:      [email or web address]      password [email or web address]  If 7PM-7AM, please contact night-coverage [email or web address] Password [email or web address] 03/29/2013, 12:20 PM   LOS: 1 day

## 2013-03-29 NOTE — Progress Notes (Signed)
[name] [id number]  Transfer Data: 03/29/2013 5:53 PM  Attending Provider: Lonia Blood, MD  [id number],[id number] ALLEN, MD  Code Status: FULL  [name] is a 79 y.o. male patient [facility]  -No acute distress noted.  -No complaints of shortness of breath.  -No complaints of chest pain.   Blood pressure 164/69, pulse 78, temperature 96.9 F (36.1 C), temperature source Axillary, resp. rate 27, height 5\' 9"  (1.753 m), weight 77.8 kg (171 lb 8.3 oz), SpO2 94.00%.  ?  IV Fluids: IV in place, occlusive dsg intact without redness, IV cath antecubital left, condition patent and no redness  normal saline.  Allergies: Review of patient's allergies indicates no known allergies.  Past Medical History:  has a past medical history of GERD (gastroesophageal reflux disease); Pure hypercholesterolemia; Paralysis agitans; Mixed hyperlipidemia; Unspecified essential hypertension; Hypertrophy of prostate without urinary obstruction and other lower urinary tract symptoms (LUTS); Obesity, unspecified; Chronic airway obstruction, not elsewhere classified; Hypertension; REM sleep behavior disorder; Memory disorder; and Parkinson disease.  Past Surgical History:  has past surgical history that includes Hemorrhoid surgery and Tonsillectomy.  Social History:  reports that he quit smoking about 34 years ago. His smoking use included Cigarettes. He has a 60 pack-year smoking history. He has never used smokeless tobacco. He reports that he does not drink alcohol or use illicit drugs.    Patient/Family orientated to room. Information packet given to patient/family. Admission inpatient armband information verified with patient/family to include name and date of birth and placed on patient arm. Side rails up x 2, fall assessment and education completed with patient/family. Patient/family able to verbalize understanding of risk associated with falls and verbalized understanding to call for assistance  before getting out of bed. Call light within reach. Patient/family able to voice and demonstrate understanding of unit orientation instructions.  Will continue to evaluate and treat per MD orders.

## 2013-03-30 ENCOUNTER — Inpatient Hospital Stay (HOSPITAL_COMMUNITY): Payer: Medicare Other

## 2013-03-30 DIAGNOSIS — J9600 Acute respiratory failure, unspecified whether with hypoxia or hypercapnia: Secondary | ICD-10-CM

## 2013-03-30 DIAGNOSIS — R4182 Altered mental status, unspecified: Secondary | ICD-10-CM

## 2013-03-30 LAB — CBC
HCT: 40.8 % (ref 39.0–52.0)
Hemoglobin: 13.5 g/dL (ref 13.0–17.0)
MCV: 98.6 fL (ref 78.0–100.0)
RBC: 4.14 MIL/uL — ABNORMAL LOW (ref 4.22–5.81)
WBC: 8 10*3/uL (ref 4.0–10.5)

## 2013-03-30 LAB — BASIC METABOLIC PANEL
BUN: 13 mg/dL (ref 6–23)
CO2: 34 mEq/L — ABNORMAL HIGH (ref 19–32)
Chloride: 103 mEq/L (ref 96–112)
Creatinine, Ser: 0.8 mg/dL (ref 0.50–1.35)
GFR calc Af Amer: >90 mL/min (ref >90)
GFR calc non Af Amer: 80 mL/min — ABNORMAL LOW (ref >90)
Glucose, Bld: 100 mg/dL — ABNORMAL HIGH (ref 70–99)
Potassium: 3.5 mEq/L (ref 3.5–5.1)
Sodium: 143 mEq/L (ref 135–145)

## 2013-03-30 MED ORDER — FUROSEMIDE 10 MG/ML IJ SOLN
40.0000 mg | Freq: Once | INTRAMUSCULAR | Status: AC
  Administered 2013-03-30: 40 mg via INTRAVENOUS
  Filled 2013-03-30: qty 4

## 2013-03-30 MED ORDER — ALBUTEROL SULFATE (5 MG/ML) 0.5% IN NEBU: 2.5000 mg | INHALATION_SOLUTION | RESPIRATORY_TRACT | Status: DC | PRN

## 2013-03-30 MED ORDER — IPRATROPIUM BROMIDE 0.02 % IN SOLN
RESPIRATORY_TRACT | Status: AC
  Administered 2013-03-30: 0.5 mg via RESPIRATORY_TRACT
  Filled 2013-03-30: qty 2.5

## 2013-03-30 MED ORDER — ALBUTEROL SULFATE (5 MG/ML) 0.5% IN NEBU
INHALATION_SOLUTION | RESPIRATORY_TRACT | Status: AC
  Administered 2013-03-30: 2.5 mg via RESPIRATORY_TRACT
  Filled 2013-03-30: qty 0.5

## 2013-03-30 MED ORDER — IPRATROPIUM BROMIDE 0.02 % IN SOLN
0.5000 mg | RESPIRATORY_TRACT | Status: DC
  Administered 2013-03-30: 0.5 mg via RESPIRATORY_TRACT

## 2013-03-30 MED ORDER — MAGNESIUM HYDROXIDE 400 MG/5ML PO SUSP
30.0000 mL | Freq: Once | ORAL | Status: AC
  Administered 2013-03-30: 10:00:00 30 mL via ORAL
  Filled 2013-03-30: qty 30

## 2013-03-30 MED ORDER — ALBUTEROL SULFATE (5 MG/ML) 0.5% IN NEBU
2.5000 mg | INHALATION_SOLUTION | RESPIRATORY_TRACT | Status: DC
  Administered 2013-03-30: 2.5 mg via RESPIRATORY_TRACT

## 2013-03-30 MED ORDER — IPRATROPIUM BROMIDE 0.02 % IN SOLN: 0.5000 mg | Freq: Four times a day (QID) | RESPIRATORY_TRACT | Status: DC | PRN

## 2013-03-30 MED ORDER — LEVOFLOXACIN IN D5W 750 MG/150ML IV SOLN
750.0000 mg | INTRAVENOUS | Status: DC
  Administered 2013-03-30 – 2013-03-31 (×2): 750 mg via INTRAVENOUS
  Filled 2013-03-30 (×3): qty 150

## 2013-03-30 MED ORDER — PANTOPRAZOLE SODIUM 40 MG PO TBEC
40.0000 mg | DELAYED_RELEASE_TABLET | Freq: Two times a day (BID) | ORAL | Status: DC
  Administered 2013-03-30 – 2013-03-31 (×2): 40 mg via ORAL
  Filled 2013-03-30 (×2): qty 1

## 2013-03-30 NOTE — Progress Notes (Signed)
[facility] Progress Note    [name] ZOX:[id number] DOB: Apr 06, 1927 DOA: 03/28/2013 PCP: Evette Georges, MD  Admit HPI / Brief Narrative: 79 y.o. male with a history of COPD requiring 2 L of oxygen, BPH with indwelling Foley, hypertension, Parkinson's disease who presented to the emergency department for altered mental status as well as passing out. Patient's wife stated that the patient had gone to the bathroom;  upon returning to the bed he passed out onto the bed. She also stated he'd been somewhat more confused lately. She did state that with the Parkinson's he is intermittently confused however it had been increasing recently.   HPI/Subjective:   Assessment/Plan:  Acute hypercarbic respiratory failure in setting of COPD - chronic O2 dependent  Required Bipap on admission Improved then had an acute hypoxic event this am (12/9) CXR pending, IVF changed to [facility], nebulizers ordered, One dose of IV lasix ordered.  ProBNP at 774 Per wife, at baseline, patient has paradoxical breathing involving abdominal muscles. Appreciate Pulmonology consultation.  Patient normally sees Dr. [name].  Urinary tract infection with history of indwelling Foley catheter Cont empiric abx tx  Cultures still pending will need to consider replacement or simply discontinuation of foley cath.  Recommend outpatient follow up with Urology.  BPH requiring indwelling Foley catheter As discussed above  Metabolic encephalopathy Resolved.  Patient normally Alert and orientated x 2.  Speaks slowly. CT of the head did not show any intracranial abnormalities  B12 is normal, ammonia level is normal, likely due to hypercarbia due to COPD    Syncopal episode Etiology not clear.  Othostasis vs related to parkinson's medications vs UTI no evidence of arrhythmia so will d/c tele  PT/OT  2D Echo completed on 02/02/13/  HTN Moderate control Bystolic 2.5 mg and HCTZ 12.5 mg.  GERD ppi  Parkinson's  disease  Followed by Dr. Anne Hahn at [facility] - cont outpt tx regimen Recommend hospital follow up considering syncopal event.  Code Status: FULL Family Communication: spoke w/ wife  Disposition Plan: home when appropriate. 12/10 - 12/11?  Consultants:   Procedures: none  Antibiotics: Rocephin 12/07 >> Levaquin 12/07  DVT prophylaxis: lovenox  Objective: Blood pressure 113/61, pulse 73, temperature 97.8 F (36.6 C), temperature source Oral, resp. rate 32, height 5\' 9"  (1.753 m), weight 77.6 kg (171 lb 1.2 oz), SpO2 94.00%.  Intake/Output Summary (Last 24 hours) at 03/30/13 1147 Last data filed at 03/30/13 1000  Gross per 24 hour  Intake    240 ml  Output   3300 ml  Net  -3060 ml   Exam: General: 79 yo male, appear chronically ill, slow to speak, short of breath when speaking. Lungs: very distant bs th/o all fields, poor air mvmt, mild exp wheeze.  ++ use of accessory muscles to breathe. Cardiovascular: Regular rate and rhythm without murmur gallop or rub normal S1 and S2 - distant HS Abdomen: Nontender, nondistended, soft, bowel sounds positive, no rebound, no ascites, no appreciable mass Extremities: No significant cyanosis, clubbing, or edema bilateral lower extremities  Data Reviewed: Basic Metabolic Panel:  Recent Labs Lab 03/28/13 0724 03/28/13 1257 03/29/13 0430 03/30/13 0731  NA 142  --  142 143  K 3.8  --  3.5 3.5  CL 100  --  103 103  CO2 37*  --  35* 34*  GLUCOSE 125*  --  98 100*  BUN 15  --  13 13  CREATININE 0.82 0.76 0.84 0.80  CALCIUM 9.2  --  9.2  8.9   Liver Function Tests:  Recent Labs Lab 03/28/13 0724  AST 13  ALT <5  ALKPHOS 79  BILITOT 0.4  PROT 5.8*  ALBUMIN 3.4*    Recent Labs Lab 03/28/13 1200  AMMONIA 38   CBC:  Recent Labs Lab 03/28/13 0724 03/28/13 1257 03/29/13 0430 03/30/13 0731  WBC 7.1 7.2 7.6 8.0  HGB 13.7 14.2 13.0 13.5  HCT 42.0 43.2 39.9 40.8  MCV 99.5 99.5 98.0 98.6  PLT 134* 134* 144* 148*   Cardiac  Enzymes:  Recent Labs Lab 03/28/13 0724  TROPONINI <0.30   BNP (last 3 results)  Recent Labs  01/31/13 1900 03/30/13 0731  PROBNP 872.0* 774.6*    Studies:  Recent x-ray studies have been reviewed in detail by the Attending Physician  Scheduled Meds:  Scheduled Meds: . arformoterol  15 mcg Nebulization BID  . aspirin  325 mg Oral Daily  . budesonide  0.5 mg Nebulization BID  . carbidopa-levodopa  1.5 tablet Oral QID  . cefTRIAXone (ROCEPHIN)  IV  1 g Intravenous Q24H  . docusate sodium  100 mg Oral BID  . enoxaparin (LOVENOX) injection  40 mg Subcutaneous Q24H  . entacapone  200 mg Oral QID  . finasteride  5 mg Oral Daily  . hydrochlorothiazide  12.5 mg Oral Daily  . nebivolol  2.5 mg Oral Daily  . pantoprazole  40 mg Oral BID AC  . polyethylene glycol  17 g Oral Daily  . senna  1 tablet Oral BID  . sodium chloride  3 mL Intravenous Q12H  . tamsulosin  0.4 mg Oral Daily    Time spent on care of this patient: 35 mins   [place] [facility] [phone]  if 7PM-7AM, please contact night-coverage [email or web address] Password [email or web address] 03/30/2013, 11:47 AM   LOS: 2 days    Addendum  I personally examined patient on 03/30/2013, agree with the above findings. This morning patient becoming hypoxemic, with increased work of breathing, using accessory muscles, noted expiratory wheezing. Blood gas showed a pH of 7.37 with a PCO2 of 62 and a PO2 of 49.4. He was given nebs as well as IV lasix and discontinuation of IV fluids with subsequent improvement. He had an echo done on 02/02/13 which showed an EF of 55-60%. Chest x-ray showed increased interstitial markings throughout left lung and right base when compared to previous study. Radiology reporting that this may represent worsening atelectasis or interstitial pneumonia.  Ceftriaxone has been discontinued, starting Levaquin 750 mg IV every 24 hours. Will follow closely.

## 2013-03-30 NOTE — Evaluation (Signed)
Physical Therapy Evaluation Patient Details Name: [name] MRN: [id number] DOB: 1927/04/09 Today's Date: 03/30/2013 Time: 1510-1530 PT Time Calculation (min): 20 min  PT Assessment / Plan / Recommendation History of Present Illness  With a history of COPD requiring 2 L of oxygen, BPH with indwelling Foley, hypertension, Parkinson's disease that presents emergency department for altered mental status as well as passing out.  Clinical Impression  Pt has difficulty with commands and backing up to chair, presenting safety issues.  Feel that he should be able to walk with RW and supplemental O2 and be able to return home with nursing and PT support as family is hoping.     PT Assessment  Patient needs continued PT services    Follow Up Recommendations  Home health PT    Does the patient have the potential to tolerate intense rehabilitation      Barriers to Discharge        Equipment Recommendations  None recommended by PT    Recommendations for Other Services     Frequency Min 3X/week    Precautions / Restrictions Precautions Precautions: Fall Restrictions Weight Bearing Restrictions: No   Pertinent Vitals/Pain No c/o pain , O2 maintained      Mobility  Bed Mobility Bed Mobility: Supine to Sit;Sitting - Scoot to Edge of Bed Supine to Sit: With rails;HOB elevated;3: Mod assist Sitting - Scoot to Edge of Bed: 4: Min assist;With rail Sit to Supine: 4: Min assist;HOB flat Scooting to HOB: 2: Max assist Details for Bed Mobility Assistance: Pt needs to have envionment set up for him to easily perform task(position equipment, place hands on handrails to push up, but once it is, he is able to move himself better Transfers Transfers: Sit to Stand;Stand to Sit Sit to Stand: From bed;From chair/3-in-1;With upper extremity assist;3: Mod assist Stand to Sit: 4: Min assist;To bed;To chair/3-in-1;With upper extremity assist Details for Transfer Assistance: assist to initiate  lifting hips off bed, assist to get arms to chair armrests to sit Ambulation/Gait Ambulation/Gait Assistance: 4: Min assist Ambulation Distance (Feet): 3 Feet Assistive device: Rolling walker Ambulation/Gait Assistance Details: pt did well with walking forward with RW,but had difficulty backing up to chair to prepare to sit. Gait Pattern: Step-through pattern General Gait Details: did not progress gait this pm, due to respiratory difficulty this am and pt needing and extra person assist for safety  Pt walked away from  wall and O2 tubing pulled away from wall and pt was unable to stop and wait for that to be corrected Stairs: No Wheelchair Mobility Wheelchair Mobility: No    Exercises     PT Diagnosis: Difficulty walking;Abnormality of gait;Generalized weakness  PT Problem List: Decreased activity tolerance;Decreased balance;Decreased mobility;Decreased safety awareness;Decreased knowledge of precautions;Cardiopulmonary status limiting activity PT Treatment Interventions: DME instruction;Gait training;Functional mobility training;Therapeutic activities;Therapeutic exercise;Balance training;Patient/family education     PT Goals(Current goals can be found in the care plan section) Acute Rehab PT Goals Patient Stated Goal: per daughter, to take pt home PT Goal Formulation: With family Time For Goal Achievement: 04/13/13 Potential to Achieve Goals: Good  Visit Information  Assistance Needed: +2 (safety, O2 tank) History of Present Illness: With a history of COPD requiring 2 L of oxygen, BPH with indwelling Foley, hypertension, Parkinson's disease that presents emergency department for altered mental status as well as passing out.       Prior Functioning  Home Living Family/patient expects to be discharged to:: Private residence Living Arrangements: Spouse/significant other Available Help  at Discharge: Family;Available 24 hours/day Type of Home: House Home Access: Stairs to  enter Entrance Stairs-Rails: None Home Layout: One level Home Equipment: Grab bars - tub/shower;Grab bars - toilet;Walker - 2 wheels;Wheelchair - [employer];Other (comment) Prior Function Level of Independence: Independent Comments: daughter reports pt has recently been at [facility] and pt did not like that.  He has HHRN and HHPT and family would like for pt to be able to return home with this assist Communication Communication: No difficulties Dominant Hand: Right    Cognition  Cognition Arousal/Alertness: Awake/alert Behavior During Therapy: Impulsive Overall Cognitive Status: Impaired/Different from baseline Area of Impairment: Safety/judgement;Following commands;Memory;Attention;Orientation Orientation Level: Disoriented to;Place;Time;Situation Current Attention Level: Alternating Memory: Decreased short-term memory Following Commands: Follows one step commands consistently Safety/Judgement: Decreased awareness of safety;Decreased awareness of deficits General Comments: pt had difficulty following commands    Extremity/Trunk Assessment Upper Extremity Assessment Upper Extremity Assessment: Overall WFL for tasks assessed;Generalized weakness Lower Extremity Assessment Lower Extremity Assessment: Generalized weakness;Overall [facility] for tasks assessed Cervical / Trunk Assessment Cervical / Trunk Assessment: Normal   Balance Balance Balance Assessed: Yes Static Sitting Balance Static Sitting - Balance Support: No upper extremity supported;Feet supported Static Sitting - Level of Assistance: 5: Stand by assistance Dynamic Sitting Balance Dynamic Sitting - Balance Support: No upper extremity supported;Feet unsupported;During functional activity Dynamic Sitting - Level of Assistance: Other (comment) (min guard A) Static Standing Balance Static Standing - Balance Support: Bilateral upper extremity supported;During functional activity Static Standing - Level of  Assistance: 5: Stand by assistance Dynamic Standing Balance Dynamic Standing - Balance Support: Left upper extremity supported;No upper extremity supported;During functional activity Dynamic Standing - Level of Assistance: 3: Mod assist  End of Session PT - End of Session Activity Tolerance: Patient tolerated treatment well Patient left: in chair;with family/visitor present Nurse Communication: Mobility status  GP    Bayard Hugger. [place], [place] [phone] 03/30/2013, 3:42 PM

## 2013-03-30 NOTE — Consult Note (Signed)
PULMONARY  / CRITICAL CARE MEDICINE  Name: [name] MRN: [id number] DOB: 01/12/1927    ADMISSION DATE:  03/28/2013 CONSULTATION DATE: [name]  REFERRING MD :  [facility] PRIMARY SERVICE: [facility]  CHIEF COMPLAINT:  Resp distress  BRIEF PATIENT DESCRIPTION:    SIGNIFICANT EVENTS / STUDIES:  12-7 syncope after having a bm.  LINES / TUBES:  CULTURES:  ANTIBIOTICS: 12-7 roc>>  HISTORY OF PRESENT ILLNESS:   79 yo Wm with known hypoxia/hypercarbia, Dr [name] pt with FEV1 42%, who was admitted 12-7 after having a syncopal episode. He has chronic dementia and his wife is his care giver. He has not been on Bd's and was noted to have an acute desaturation and wheezing on 12-9. He responded to BD's and lasix. His wife felt he became excited and this precipitated respiratory distress. He is on 3 l [place] and his sats are 95% and not in acute distress. PCCM asked to consult  PAST MEDICAL HISTORY :  Past Medical History  Diagnosis Date  . GERD (gastroesophageal reflux disease)   . Pure hypercholesterolemia   . Paralysis agitans   . Mixed hyperlipidemia   . Unspecified essential hypertension   . Hypertrophy of prostate without urinary obstruction and other lower urinary tract symptoms (LUTS)   . Obesity, unspecified   . Chronic airway obstruction, not elsewhere classified   . Hypertension   . REM sleep behavior disorder   . Memory disorder   . Parkinson disease    Past Surgical History  Procedure Laterality Date  . Hemorrhoid surgery    . Tonsillectomy     Prior to Admission medications   Medication Sig Start Date End Date Taking? Authorizing Provider  arformoterol (BROVANA) 15 MCG/2ML NEBU Take 15 mcg by nebulization 2 (two) times daily.   Yes Historical Provider, MD  aspirin 325 MG tablet Take 325 mg by mouth daily.    Yes Historical Provider, MD  budesonide (PULMICORT) 0.5 MG/2ML nebulizer solution Take 0.5 mg by nebulization 2 (two) times daily.   Yes Historical Provider, MD   carbidopa-levodopa-entacapone (STALEVO) 37.5-150-200 MG per tablet Take 1 tablet by mouth 4 (four) times daily.   Yes Historical Provider, MD  finasteride (PROSCAR) 5 MG tablet Take 5 mg by mouth daily. 03/02/13  Yes Historical Provider, MD  hydrochlorothiazide (HYDRODIURIL) 12.5 MG tablet Take 1 tablet (12.5 mg total) by mouth daily. 03/25/13  Yes [name], MD  magnesium hydroxide (MILK OF MAGNESIA) 400 MG/5ML suspension Take 30 mLs by mouth every Wednesday.   Yes Historical Provider, MD  Multiple Vitamin (MULTIVITAMIN) capsule Take 1 capsule by mouth daily.     Yes Historical Provider, MD  nebivolol (BYSTOLIC) 5 MG tablet Take 2.5 mg by mouth daily.    Yes Historical Provider, MD  omeprazole (PRILOSEC) 20 MG capsule Take 1 capsule (20 mg total) by mouth daily. 30-60 minutes before first meal of the day 03/25/13  Yes [name], MD  polyethylene glycol [facility] / GLYCOLAX) packet Take 17 g by mouth daily. 02/04/13  Yes [name], MD  senna (SENOKOT) 8.6 MG TABS tablet Take 1 tablet by mouth daily as needed for mild constipation.   Yes Historical Provider, MD  tamsulosin (FLOMAX) 0.4 MG CAPS capsule Take 1 capsule (0.4 mg total) by mouth daily. 02/04/13  Yes [name], MD  albuterol (PROVENTIL HFA;VENTOLIN HFA) 108 (90 BASE) MCG/ACT inhaler Inhale 2 puffs into the lungs every 4 (four) hours as needed. 12/11/12   [name], MD  No Known Allergies  FAMILY HISTORY:  Family History  Problem Relation Age of Onset  . Coronary artery disease    . Heart attack Brother   . Heart attack Mother    SOCIAL HISTORY:  reports that he quit smoking about 34 years ago. His smoking use included Cigarettes. He has a 60 pack-year smoking history. He has never used smokeless tobacco. He reports that he does not drink alcohol or use illicit drugs.  REVIEW OF SYSTEMS:   10 point review of system taken, please see HPI for positives and negatives.   SUBJECTIVE:   VITAL SIGNS: Temp:  [96.9  F (36.1 C)-98.5 F (36.9 C)] 97.8 F (36.6 C) (12/09 0558) Pulse Rate:  [66-73] 73 (12/09 1025) Resp:  [20-32] 32 (12/09 1025) BP: (106-166)/(61-77) 113/61 mmHg (12/09 1025) SpO2:  [87 %-96 %] 94 % (12/09 1025) Weight:  [171 lb 1.2 oz (77.6 kg)] 171 lb 1.2 oz (77.6 kg) (12/08 1836) HEMODYNAMICS:   VENTILATOR SETTINGS:   INTAKE / OUTPUT: Intake/Output     12/08 0701 - 12/09 0700 12/09 0701 - 12/10 0700   P.O. 120    I.V. (mL/kg)     Total Intake(mL/kg) 120 (1.5)    Urine (mL/kg/hr) 3300 (1.8)    Total Output 3300     Net -3180          Urine Occurrence 2 x      PHYSICAL EXAMINATION: General:  Frail dementia white male Neuro:  Confused but follows commands HEENT:  No JVD/LAN Cardiovascular:  HSR RRR Lungs:  + exp wheeze mild diffuse Abdomen:  + bs, no n/g Musculoskeletal:  Wasted musculature Skin:  Multiple ecchymotic areas  LABS:  CBC  Recent Labs Lab 03/28/13 1257 03/29/13 0430 03/30/13 0731  WBC 7.2 7.6 8.0  HGB 14.2 13.0 13.5  HCT 43.2 39.9 40.8  PLT 134* 144* 148*   Coag's No results found for this basename: APTT, INR,  in the last 168 hours BMET  Recent Labs Lab 03/28/13 0724 03/28/13 1257 03/29/13 0430 03/30/13 0731  NA 142  --  142 143  K 3.8  --  3.5 3.5  CL 100  --  103 103  CO2 37*  --  35* 34*  BUN 15  --  13 13  CREATININE 0.82 0.76 0.84 0.80  GLUCOSE 125*  --  98 100*   Electrolytes  Recent Labs Lab 03/28/13 0724 03/29/13 0430 03/30/13 0731  CALCIUM 9.2 9.2 8.9   Sepsis Markers No results found for this basename: LATICACIDVEN, PROCALCITON, O2SATVEN,  in the last 168 hours ABG  Recent Labs Lab 03/28/13 0745 03/28/13 1428 03/30/13 0949  PHART 7.319* 7.421 7.375  PCO2ART 72.0* 52.0* 62.0*  PO2ART 84.0 82.3 49.4*   Liver Enzymes  Recent Labs Lab 03/28/13 0724  AST 13  ALT <5  ALKPHOS 79  BILITOT 0.4  ALBUMIN 3.4*   Cardiac Enzymes  Recent Labs Lab 03/28/13 0724  TROPONINI <0.30   Glucose No results  found for this basename: GLUCAP,  in the last 168 hours  Imaging No results found.   CXR:   ASSESSMENT / PLAN:  PULMONARY A:Gold stage 3 COPD.    Chronic P:   Lasix IS addition No IV steroids at this time, after lasix improved Continue [place] PRN albuterol/atrovent  O2 as needed Avoid overstimulation Consider swallow evaluation Check c x r now and in am   CARDIOVASCULAR A: Syncope ? etiology P:  ? Cards work up for syncope Consider repeat echo,  would want to see if has new LF dysfxn, diastolic dysfxn or valvualr dz increasing edema lasix  RENAL A:  diuresis P:  Chem in am kvo  GASTROINTESTINAL A:  GERD P:   Increase PPI to BID High risk dysphagia, may need slp  NEUROLOGIC A:  Dementia       Parkinson Dz  P:   Frequent orientation May need slp, however, no timed events noted with diet  TODAY'S SUMMARY: 79 yo Wm with known hypoxia/hypercarbia, Dr [name] pt with FEV1 42%, who was admitted 12-7 after having a syncopal episode. He has chronic dementia and his wife is his care giver. He has not been on Bd's and was noted to have an acute desaturation and wheezing on 12-9. He responded to BD's and lasix. His wife felt he became excited and this precipitated respiratory distress. He is on 3 l [place] and his sats are 95% and not in acute distress. PCCM asked to consult. Note he is a full code and may in future need intubation due to multiple medical problems.  I spoke to pt and daughter Brett Canales Minor ACNP Adolph Pollack PCCM Pager [phone] till 3 pm If no answer page [phone] 03/30/2013, 10:43 AM  I have fully examined this patient and agree with above findings.    And edited in full  Mcarthur Rossetti. Tyson Alias, MD, FACP Pgr: [phone] [facility]

## 2013-03-30 NOTE — Evaluation (Signed)
Occupational Therapy Evaluation Patient Details Name: [name] MRN: [id number] DOB: 08-26-1926 Today's Date: 03/30/2013 Time: 1110-1141 OT Time Calculation (min): 31 min  OT Assessment / Plan / Recommendation History of present illness With a history of COPD requiring 2 L of oxygen, BPH with indwelling Foley, hypertension, Parkinson's disease that presents emergency department for altered mental status as well as passing out.   Clinical Impression   Pt demos decline in function and safety with ADLs and ADL mobility with decreased balance, strength and endurance. Pt would benefit from acute OT services to address impairments to increase level of function and safety. Pt presents with confusion and requires increased time with multimodal cues for initiating and completing tasks. Pt easily distracted, but pleasant and cooperative    OT Assessment  Patient needs continued OT Services    Follow Up Recommendations  Supervision/Assistance - 24 hour;SNF    Barriers to Discharge Decreased caregiver support Family plans for pt to d/c to SNF for short term rehab  Equipment Recommendations  None recommended by OT;Other (comment) (TBD)    Recommendations for Other Services    Frequency  Min 2X/week    Precautions / Restrictions Precautions Precautions: Fall Restrictions Weight Bearing Restrictions: No   Pertinent Vitals/Pain No c/o pain    ADL  Grooming: Performed;Wash/dry hands;Wash/dry face;Minimal assistance;Moderate assistance Where Assessed - Grooming: Unsupported sitting;Supported standing Upper Body Bathing: Simulated;Supervision/safety;Set up;Min guard Lower Body Bathing: Simulated;Maximal assistance Upper Body Dressing: Performed;Supervision/safety;Set up;Min guard Lower Body Dressing: Performed;Maximal assistance Toilet Transfer: Simulated;Moderate assistance Toilet Transfer Method: Sit to stand;Stand pivot Toileting - Clothing Manipulation and Hygiene: Maximal  assistance Where Assessed - Toileting Clothing Manipulation and Hygiene: Standing Tub/Shower Transfer Method: Not assessed Equipment Used: Gait belt Transfers/Ambulation Related to ADLs: multimodal cues for initiation, sequeincing. Pt easily distracted and requires increased time to complete tasks ADL Comments: Pt requires increased time to complete funcitonal tasks due to confusion and being easily distarcted with multimodal cues for redirection    OT Diagnosis: Generalized weakness;Cognitive deficits  OT Problem List: Decreased strength;Decreased knowledge of use of DME or AE;Decreased cognition;Impaired balance (sitting and/or standing);Decreased safety awareness;Decreased activity tolerance OT Treatment Interventions: Self-care/ADL training;Therapeutic exercise;Patient/family education;Neuromuscular education;Balance training;Therapeutic activities;DME and/or AE instruction   OT Goals(Current goals can be found in the care plan section) ADL Goals Pt Will Perform Grooming: with min guard assist;standing Pt Will Perform Upper Body Bathing: with supervision;with set-up;sitting Pt Will Perform Lower Body Bathing: with mod assist;sitting/lateral leans;sit to/from stand Pt Will Perform Upper Body Dressing: with set-up;with supervision;sitting Pt Will Transfer to Toilet: with min assist;bedside commode;ambulating;regular height toilet;grab bars Pt Will Perform Toileting - Clothing Manipulation and hygiene: with mod assist;with modified independence;sitting/lateral leans;sit to/from stand Additional ADL Goal #1: Pt will complete bed mobility with min guard A to sit EOB in prep for ADLs  Visit Information  Last OT Received On: 03/30/13 Assistance Needed: +1 History of Present Illness: With a history of COPD requiring 2 L of oxygen, BPH with indwelling Foley, hypertension, Parkinson's disease that presents emergency department for altered mental status as well as passing out.       Prior  Functioning     Home Living Family/patient expects to be discharged to:: Private residence Living Arrangements: Spouse/significant other Available Help at Discharge: Family;Available 24 hours/day Type of Home: House Home Access: Stairs to enter Entrance Stairs-Rails: None Home Layout: One level Home Equipment: Grab bars - tub/shower;Grab bars - toilet;Walker - 2 wheels;Wheelchair - [employer];Other (comment) Prior Function Level of Independence: Independent Communication  Communication: No difficulties Dominant Hand: Right         Vision/Perception Vision - History Baseline Vision: Wears glasses all the time Patient Visual Report: No change from baseline Perception Perception: Within Functional Limits   Cognition  Cognition Arousal/Alertness: Awake/alert Behavior During Therapy: Impulsive Overall Cognitive Status: Impaired/Different from baseline Area of Impairment: Safety/judgement;Following commands;Memory;Attention;Orientation Orientation Level: Disoriented to;Place;Time;Situation Current Attention Level: Alternating;Divided Memory: Decreased short-term memory Following Commands: Follows one step commands consistently Safety/Judgement: Decreased awareness of safety;Decreased awareness of deficits General Comments: pt requires multimodal cues to initiate and attend to tasks, slow processing    Extremity/Trunk Assessment Upper Extremity Assessment Upper Extremity Assessment: Overall WFL for tasks assessed;Generalized weakness Lower Extremity Assessment Lower Extremity Assessment: Defer to PT evaluation     Mobility Bed Mobility Bed Mobility: Supine to Sit;Sitting - Scoot to [place] of Bed;Sit to Supine;Scooting to [facility] Supine to Sit: 4: Min assist;With rails;HOB elevated Sitting - Scoot to Edge of Bed: 4: Min assist;With rail Sit to Supine: 4: Min assist;HOB flat Scooting to HOB: 2: Max assist Transfers Transfers: Sit to Stand;Stand to Sit Sit to  Stand: 3: Mod assist;From bed;From chair/3-in-1;With upper extremity assist Stand to Sit: 4: Min assist;To bed;To chair/3-in-1;With upper extremity assist Details for Transfer Assistance: multimodal cues for initiation, sequeincing. Pt easily distracted and requires increased time to complete tasks     Exercise     Balance Balance Balance Assessed: Yes Dynamic Sitting Balance Dynamic Sitting - Balance Support: No upper extremity supported;Feet unsupported;During functional activity Dynamic Sitting - Level of Assistance: Other (comment) (min guard A) Dynamic Standing Balance Dynamic Standing - Balance Support: Left upper extremity supported;No upper extremity supported;During functional activity Dynamic Standing - Level of Assistance: 3: Mod assist   End of Session OT - End of Session Equipment Utilized During Treatment: Gait belt Activity Tolerance: Patient tolerated treatment well Patient left: in bed;with call bell/phone within reach;with family/visitor present  GO     Galen Manila 03/30/2013, 1:49 PM

## 2013-03-31 ENCOUNTER — Inpatient Hospital Stay (HOSPITAL_COMMUNITY): Payer: Medicare Other

## 2013-03-31 DIAGNOSIS — J9610 Chronic respiratory failure, unspecified whether with hypoxia or hypercapnia: Secondary | ICD-10-CM

## 2013-03-31 DIAGNOSIS — R55 Syncope and collapse: Secondary | ICD-10-CM

## 2013-03-31 DIAGNOSIS — J189 Pneumonia, unspecified organism: Secondary | ICD-10-CM

## 2013-03-31 LAB — URINE CULTURE: Colony Count: >=100000

## 2013-03-31 LAB — BLOOD GAS, ARTERIAL
Acid-Base Excess: 10 mmol/L — ABNORMAL HIGH (ref 0.0–2.0)
Bicarbonate: 35.4 meq/L — ABNORMAL HIGH (ref 20.0–24.0)
Drawn by: 244801
O2 Content: 4 L/min
O2 Saturation: 85.4 %
Patient temperature: 98.6
TCO2: 37.3 mmol/L (ref 0–100)
pCO2 arterial: 62 mmHg (ref 35.0–45.0)
pH, Arterial: 7.375 (ref 7.350–7.450)
pO2, Arterial: 49.4 mmHg — ABNORMAL LOW (ref 80.0–100.0)

## 2013-03-31 LAB — CBC
HCT: 40.6 % (ref 39.0–52.0)
Hemoglobin: 13.6 g/dL (ref 13.0–17.0)
MCH: 32.7 pg (ref 26.0–34.0)
MCV: 97.6 fL (ref 78.0–100.0)
RBC: 4.16 MIL/uL — ABNORMAL LOW (ref 4.22–5.81)
WBC: 6.9 10*3/uL (ref 4.0–10.5)

## 2013-03-31 LAB — BASIC METABOLIC PANEL
BUN: 14 mg/dL (ref 6–23)
CO2: 34 mEq/L — ABNORMAL HIGH (ref 19–32)
Calcium: 9 mg/dL (ref 8.4–10.5)
Chloride: 101 mEq/L (ref 96–112)
Creatinine, Ser: 0.91 mg/dL (ref 0.50–1.35)
Glucose, Bld: 97 mg/dL (ref 70–99)

## 2013-03-31 LAB — MAGNESIUM: Magnesium: 2.6 mg/dL — ABNORMAL HIGH (ref 1.5–2.5)

## 2013-03-31 MED ORDER — OMEPRAZOLE 40 MG PO CPDR: 40.0000 mg | DELAYED_RELEASE_CAPSULE | Freq: Every day | ORAL | Status: AC

## 2013-03-31 MED ORDER — LEVOFLOXACIN 750 MG PO TABS: 750.0000 mg | ORAL_TABLET | Freq: Every day | ORAL | Status: DC

## 2013-03-31 MED ORDER — AMOXICILLIN-POT CLAVULANATE 875-125 MG PO TABS: 1.0000 | ORAL_TABLET | Freq: Two times a day (BID) | ORAL | Status: DC

## 2013-03-31 MED ORDER — ALBUTEROL SULFATE (5 MG/ML) 0.5% IN NEBU: 2.5000 mg | INHALATION_SOLUTION | RESPIRATORY_TRACT | Status: AC | PRN

## 2013-03-31 NOTE — Discharge Summary (Addendum)
Physician Discharge Summary  YANNIS BROCE ZOX:[id number] DOB: 11/12/1926 DOA: 03/28/2013  PCP: Evette Georges, MD  Admit date: 03/28/2013 Discharge date: 03/31/2013  Time spent: 45 minutes  Recommendations for Outpatient Follow-up:   Pulmonary follow up for acute on chronic COPD exacerbation  Check bmet / cbc in 1 week.  Patient has mild thrombocytopenia (cbc) and is on levaquin for 1 week (bmet)  Home health PT / OT.  Patient was offered [facility] services at home but politely refused.  Discharge Diagnoses:  Principal Problem:   Acute respiratory failure Active Problems:   PARKINSON'S DISEASE   HYPERTENSION   COPD GOLD III   BENIGN PROSTATIC HYPERTROPHY   Altered mental status   UTI (lower urinary tract infection)   Syncopal episodes   Discharge Condition: at baseline, chronic respiratory failure.  Diet recommendation: heart healthy  Filed Weights   03/28/13 1125 03/29/13 0455 03/29/13 1836  Weight: 77.3 kg (170 lb 6.7 oz) 77.8 kg (171 lb 8.3 oz) 77.6 kg (171 lb 1.2 oz)    History of present illness:  79 yo male with pmh of severe COPD on home oxygen, Parkinson's disease, BPH with indwelling foley, presented to the ED after a syncopal episode with altered mental status.  He had had increased confusion recently.  In the emergency department he was found to have a PCO2 of 72.  He was admitted with hypercarbic respiratory failure and UTI.   Hospital Course:  Acute hypercarbic respiratory failure in setting of COPD - chronic O2 dependent  Mr. Farrington was admitted to step down and required Bipap on admission.  He improved with IV antibiotics and Bronchodilators.  He eventually refused bipap. He was transferred to the floor on 12/9 and had an acute hypoxic event that seemed to be exacerbated by over stimulation (too many people in the room) as well as IVF.  He was given lasix and nebulizers and improved.  Pulmonary was consulted and adjusted his medications.  Follow up CXR  ruled out pulmonary edema and alveolar pneumonia, but there was a question of interstitial pneumonia.  Consequently the patient was placed on Levaquin.  On 12/10 Mr. Mcmeans was feeling much better.  His breathing was improved (back to baseline per Mrs. Magwood). Per wife, at baseline, patient has paradoxical breathing involving abdominal muscles.  He will be discharged on Levaquin.  Pulmonary follow up is scheduled.  As Mr. Scogin appears significantly more confused in the morning it was suggested that he may have sleep apnea.  This can be evaluated outpatient.  He remains on oxygen 24/7.   Urinary tract infection with history of indwelling Foley catheter  U/A was positive for infection.  Culture showed enterococcus, and the patient was treated with IV rocephin.  This was changed to IV levaquin on 12/10 in order to cover both UTI and possible pneumonia.  Sensitivities for the enterococcus are still pending at the time of discharge.  Patient will need to follow up outpatient with Dr. Annabell Howells regarding BPH and indwelling foley.  Addendum:  Just before discharge the urine culture sensitivities returned.  The enterococcus was resistant to Levaquin.  He will be discharged on 6 days of Augmentin.   Encephalopathy in the setting of dementia. Likely due to UTI +/- hypercarbia.  Fortunately resolved. Patient normally Alert and orientated x 2. Speaks slowly.  CT of the head did not show any intracranial abnormalities.  B12 is normal, ammonia level is normal.   Syncopal episode  CT head showed no acute abnormalities.  Patient was monitored on telemetry during his stay.  Etiology of syncope not clear. Othostasis vs parkinson's medications vs UTI.  PT/OT evaluations completed and home health services are ordered. 2D Echo completed on 02/02/13 and was not repeated during this hospitalization (results are below).   HTN  Moderate control  Bystolic 2.5 mg and HCTZ 12.5 mg.    GERD  ppi dose increased given  COPD exacerbation.   Parkinson's disease  Followed by Dr. Anne Hahn at [facility] - cont outpt tx regimen  Recommend hospital follow up considering syncopal event.    Code Status: FULL  Family Communication: spoke w/ wife  Disposition Plan: home with home health.   Procedures:  2D Echo 10/14 Study Conclusions  Left ventricle: The cavity size was normal. Wall thickness was increased in a pattern of mild LVH. Systolic function was normal. The estimated ejection fraction was in the range of 55% to 60%. Although no diagnostic regional wall motion abnormality was identified, this possibility cannot be completely excluded on the basis of this study.   Consultations:  Infectious Disease  Pulmonology   Discharge Exam: Filed Vitals:   03/31/13 0546  BP: 145/61  Pulse: 69  Temp: 97.9 F (36.6 C)  Resp: 20    General: Awake, pleasantly confused, tremoring, abdomen moving with breathing.   Cardiovascular: rrr no m/r/g no lower extremity edema  Respiratory: increased work of breathing (wife says this is his normal), poor air movement, no wheeze Abdomen: soft,nt, nd, +bs, no obvious masses Extremities:  Symmetrically weak but with 5/5 strength in each extremity.  Discharge Instructions      Discharge Orders   Future Appointments Provider Department Dept Phone   04/09/2013 9:15 AM Julio Sicks, NP [facility] [phone]   04/26/2013 11:00 AM York Spaniel, MD [facility] [phone]   Future Orders Complete By Expires   Diet - low sodium heart healthy  As directed    Increase activity slowly  As directed        Medication List         albuterol 108 (90 BASE) MCG/ACT inhaler  Commonly known as:  PROVENTIL HFA;VENTOLIN HFA  Inhale 2 puffs into the lungs every 4 (four) hours as needed.     albuterol (5 MG/ML) 0.5% nebulizer solution  Commonly known as:  PROVENTIL  Take 0.5 mLs (2.5 mg total) by nebulization every 3 (three) hours as needed  for wheezing or shortness of breath.     amoxicillin-clavulanate 875-125 MG per tablet  Commonly known as:  AUGMENTIN  Take 1 tablet by mouth 2 (two) times daily.     arformoterol 15 MCG/2ML Nebu  Commonly known as:  BROVANA  Take 15 mcg by nebulization 2 (two) times daily.     aspirin 325 MG tablet  Take 325 mg by mouth daily.     budesonide 0.5 MG/2ML nebulizer solution  Commonly known as:  PULMICORT  Take 0.5 mg by nebulization 2 (two) times daily.     carbidopa-levodopa-entacapone 37.5-150-200 MG per tablet  Commonly known as:  STALEVO  Take 1 tablet by mouth 4 (four) times daily.     finasteride 5 MG tablet  Commonly known as:  PROSCAR  Take 5 mg by mouth daily.     hydrochlorothiazide 12.5 MG tablet  Commonly known as:  HYDRODIURIL  Take 1 tablet (12.5 mg total) by mouth daily.     magnesium hydroxide 400 MG/5ML suspension  Commonly known as:  MILK OF MAGNESIA  Take 30  mLs by mouth every Wednesday.     multivitamin capsule  Take 1 capsule by mouth daily.     nebivolol 5 MG tablet  Commonly known as:  BYSTOLIC  Take 2.5 mg by mouth daily.     omeprazole 40 MG capsule  Commonly known as:  PRILOSEC  Take 1 capsule (40 mg total) by mouth daily. 30-60 minutes before first meal of the day     polyethylene glycol packet  Commonly known as:  MIRALAX / GLYCOLAX  Take 17 g by mouth daily.     senna 8.6 MG Tabs tablet  Commonly known as:  SENOKOT  Take 1 tablet by mouth daily as needed for mild constipation.     tamsulosin 0.4 MG Caps capsule  Commonly known as:  FLOMAX  Take 1 capsule (0.4 mg total) by mouth daily.       No Known Allergies Follow-up Information   Follow up with PARRETT,TAMMY, NP On 04/09/2013. (915am )    Specialty:  Nurse Practitioner   Contact information:   [address] [place] [address] [phone]       Follow up with TODD,JEFFREY Freida Busman, MD. Schedule an appointment as soon as possible for a visit in 1 week.   Specialty:   Family Medicine   Contact information:   [address] [place] Prabhakar [place] [address] [phone]       Follow up with Anner Crete, MD.   Specialty:  Urology   Contact information:   [address] [place] [address] [phone]       Follow up with Lesly Dukes, MD. Schedule an appointment as soon as possible for a visit in 2 weeks. (Follow up for parkinson's )    Specialty:  Neurology   Contact information:   [address] [place] [address] [phone]        The results of significant diagnostics from this hospitalization (including imaging, microbiology, ancillary and laboratory) are listed below for reference.    Significant Diagnostic Studies: Ct Head Wo Contrast  03/28/2013   CLINICAL DATA:  Altered mental status and syncope, no history of recent trauma  EXAM: CT HEAD WITHOUT CONTRAST  TECHNIQUE: Contiguous axial images were obtained from the base of the skull through the vertex without intravenous contrast.  COMPARISON:  None.  FINDINGS: There is mild diffuse cerebral atrophy with compensatory ventriculomegaly. There is no shift of the midline. There is no evidence of an acute intracranial hemorrhage nor of an evolving ischemic infarction. There are no abnormal intracranial calcifications. The cerebellum and brainstem are normal in density.  At bone window settings the observed portions of the paranasal sinuses and mastoid air cells are clear. There is no evidence of an acute skull fracture.  IMPRESSION: 1. There is no evidence of an acute ischemic or hemorrhagic infarction. 2. There is no intracranial mass effect or hydrocephalus. 3. There is mild age appropriate diffuse cerebral atrophy with mild compensatory ventriculomegaly. These findings appear stable since an MRI of the brain of October 2007.   Electronically Signed   By: David  [place]   On: 03/28/2013 08:44   Dg Chest Port 1 View  03/31/2013   CLINICAL DATA:  Evaluate for  pulmonary edema  EXAM: PORTABLE CHEST - 1 VIEW  COMPARISON:  Portable chest x-ray of March 30, 2013  FINDINGS: The lungs are well-expanded. The interstitial markings remain minimally prominent but have improved slightly since yesterday's study. The cardiopericardial silhouette is not enlarged. The  pulmonary vascularity is prominent centrally but stable. There is mild tortuosity of the descending thoracic aorta. There is no pleural effusion or pneumothorax. The mediastinum is normal in width.  IMPRESSION: There may have been slight interval improvement in the appearance of the minimally increased interstitial markings in both lungs. There is no evidence of alveolar pneumonia or edema.   Electronically Signed   By: David  [place]   On: 03/31/2013 07:58   Dg Chest Port 1 View  03/30/2013   CLINICAL DATA:  Dyspnea and elevated white blood cell count.  EXAM: PORTABLE CHEST - 1 VIEW  COMPARISON:  Chest x-ray of March 28, 2013.  FINDINGS: The lungs are adequately inflated. The interstitial markings have increased on the left and at the right lung base. The cardiopericardial silhouette is not enlarged. The pulmonary vascularity is prominent centrally though stable. The mediastinum is normal in width. There is no pleural effusion or pneumothorax. The observed portions of the bony thorax exhibit no acute abnormalities.  IMPRESSION: 1. The interstitial markings are increased throughout much of the left lung and at the right lung base. These findings have increased in conspicuity since the previous study. This may reflect worsening subsegmental atelectasis or interstitial pneumonia. 2. The cardiac silhouette is not enlarged and there is no evidence of pulmonary vascular congestion.   Electronically Signed   By: David  [place]   On: 03/30/2013 11:35   Dg Chest Portable 1 View  03/28/2013   CLINICAL DATA:  Shortness of Breath  EXAM: PORTABLE CHEST - 1 VIEW  COMPARISON:  01/31/2013  FINDINGS: Cardiomediastinal silhouette  is stable. Hyperinflation and chronic mild interstitial prominence again noted. Stable left basilar atelectasis or scarring. Degenerative changes bilateral shoulders again noted. No segmental infiltrate.  IMPRESSION: Hyperinflation and chronic interstitial prominence. Stable left basilar atelectasis or scarring. Degenerative changes bilateral shoulders.   Electronically Signed   By: Natasha Mead M.D.   On: 03/28/2013 09:13    Microbiology: Recent Results (from the past 240 hour(s))  URINE CULTURE     Status: None   Collection Time    03/28/13  7:31 AM      Result Value Range Status   Specimen Description URINE, CATHETERIZED   Final   Special Requests NONE   Final   Culture  Setup Time     Final   Value: 03/28/2013 18:21     Performed at [facility] Count     Final   Value: >=100,000 COLONIES/ML     Performed at [facility]   Culture     Final   Value: ENTEROCOCCUS SPECIES     Performed at [facility]   Report Status 03/31/2013 FINAL   Final   Organism ID, Bacteria ENTEROCOCCUS SPECIES   Final     Labs: Basic Metabolic Panel:  Recent Labs Lab 03/28/13 0724 03/28/13 1257 03/29/13 0430 03/30/13 0731 03/31/13 0600  NA 142  --  142 143 143  K 3.8  --  3.5 3.5 3.6  CL 100  --  103 103 101  CO2 37*  --  35* 34* 34*  GLUCOSE 125*  --  98 100* 97  BUN 15  --  13 13 14   CREATININE 0.82 0.76 0.84 0.80 0.91  CALCIUM 9.2  --  9.2 8.9 9.0  MG  --   --   --   --  2.6*  PHOS  --   --   --   --  3.7  Liver Function Tests:  Recent Labs Lab 03/28/13 0724  AST 13  ALT <5  ALKPHOS 79  BILITOT 0.4  PROT 5.8*  ALBUMIN 3.4*    Recent Labs Lab 03/28/13 1200  AMMONIA 38   CBC:  Recent Labs Lab 03/28/13 0724 03/28/13 1257 03/29/13 0430 03/30/13 0731 03/31/13 0600  WBC 7.1 7.2 7.6 8.0 6.9  HGB 13.7 14.2 13.0 13.5 13.6  HCT 42.0 43.2 39.9 40.8 40.6  MCV 99.5 99.5 98.0 98.6 97.6  PLT 134* 134* 144* 148* 143*   Cardiac  Enzymes:  Recent Labs Lab 03/28/13 0724  TROPONINI <0.30   BNP: BNP (last 3 results)  Recent Labs  01/31/13 1900 03/30/13 0731  PROBNP 872.0* 774.6*     Signed:  Conley Canal [phone]  [facility] 03/31/2013, 2:37 PM

## 2013-03-31 NOTE — Progress Notes (Signed)
NURSING PROGRESS NOTE  [name] [id number] Discharge Data: 03/31/2013 2:03 PM Attending Provider: [name], MD XBM:[id number] Freida Busman, MD     Marvis Repress Fife to be D/C'd Home per MD order.  Discussed with the patient the After Visit Summary and all questions fully answered. All IV's discontinued with no bleeding noted. All belongings returned to patient for patient to take home.   Last Vital Signs:  Blood pressure 145/61, pulse 69, temperature 97.9 F (36.6 C), temperature source Oral, resp. rate 20, height 5\' 9"  (1.753 m), weight 77.6 kg (171 lb 1.2 oz), SpO2 95.00%.  Discharge Medication List   Medication List         albuterol 108 (90 BASE) MCG/ACT inhaler  Commonly known as:  PROVENTIL HFA;VENTOLIN HFA  Inhale 2 puffs into the lungs every 4 (four) hours as needed.     albuterol (5 MG/ML) 0.5% nebulizer solution  Commonly known as:  PROVENTIL  Take 0.5 mLs (2.5 mg total) by nebulization every 3 (three) hours as needed for wheezing or shortness of breath.     arformoterol 15 MCG/2ML Nebu  Commonly known as:  BROVANA  Take 15 mcg by nebulization 2 (two) times daily.     aspirin 325 MG tablet  Take 325 mg by mouth daily.     budesonide 0.5 MG/2ML nebulizer solution  Commonly known as:  PULMICORT  Take 0.5 mg by nebulization 2 (two) times daily.     carbidopa-levodopa-entacapone 37.5-150-200 MG per tablet  Commonly known as:  STALEVO  Take 1 tablet by mouth 4 (four) times daily.     finasteride 5 MG tablet  Commonly known as:  PROSCAR  Take 5 mg by mouth daily.     hydrochlorothiazide 12.5 MG tablet  Commonly known as:  HYDRODIURIL  Take 1 tablet (12.5 mg total) by mouth daily.     levofloxacin 750 MG tablet  Commonly known as:  LEVAQUIN  Take 1 tablet (750 mg total) by mouth daily.     magnesium hydroxide 400 MG/5ML suspension  Commonly known as:  MILK OF MAGNESIA  Take 30 mLs by mouth every Wednesday.     multivitamin capsule  Take 1 capsule by  mouth daily.     nebivolol 5 MG tablet  Commonly known as:  BYSTOLIC  Take 2.5 mg by mouth daily.     omeprazole 40 MG capsule  Commonly known as:  PRILOSEC  Take 1 capsule (40 mg total) by mouth daily. 30-60 minutes before first meal of the day     polyethylene glycol packet  Commonly known as:  MIRALAX / GLYCOLAX  Take 17 g by mouth daily.     senna 8.6 MG Tabs tablet  Commonly known as:  SENOKOT  Take 1 tablet by mouth daily as needed for mild constipation.     tamsulosin 0.4 MG Caps capsule  Commonly known as:  FLOMAX  Take 1 capsule (0.4 mg total) by mouth daily.

## 2013-03-31 NOTE — Discharge Summary (Signed)
Addendum  Patient seen and examined, chart and data base reviewed.  I agree with the above assessment and plan.  For full details please see Mrs. Algis Downs PA note.  Acute respiratory failure, mixed. UTI and AMS. Discharge home.   Clint Lipps, MD [facility] Pager: [phone] 03/31/2013, 12:37 PM

## 2013-03-31 NOTE — Progress Notes (Signed)
PULMONARY  / CRITICAL CARE MEDICINE  Name: ASLAN HIMES MRN: [id number] DOB: Feb 02, 1927    ADMISSION DATE:  03/28/2013 CONSULTATION DATE: [name]  REFERRING MD :  [facility] PRIMARY SERVICE: [facility]  CHIEF COMPLAINT:  Resp distress  BRIEF PATIENT DESCRIPTION:    SIGNIFICANT EVENTS / STUDIES:  12-7 syncope after having a bm.  LINES / TUBES:  CULTURES:  ANTIBIOTICS: 12-7 rocephin>>12/9 12/9 levaquin>>>   SUBJECTIVE:  No new c/o.   VITAL SIGNS: Temp:  [97.6 F (36.4 C)-98.3 F (36.8 C)] 97.9 F (36.6 C) (12/10 0546) Pulse Rate:  [61-69] 69 (12/10 0546) Resp:  [20-22] 20 (12/10 0546) BP: (121-145)/(61-70) 145/61 mmHg (12/10 0546) SpO2:  [95 %-98 %] 95 % (12/10 0929) HEMODYNAMICS:   VENTILATOR SETTINGS:   INTAKE / OUTPUT: Intake/Output     12/09 0701 - 12/10 0700 12/10 0701 - 12/11 0700   P.O. 120    IV Piggyback 150    Total Intake(mL/kg) 270 (3.5)    Urine (mL/kg/hr) 2700 (1.4)    Total Output 2700     Net -2430            PHYSICAL EXAMINATION: General:  Frail elderly male, NAD  Neuro:  Confused but follows commands HEENT:  No JVD/LAN Cardiovascular:  HSR RRR Lungs:  resps even non labored, mild exp wheeze  Abdomen:  + bs, no n/g Musculoskeletal:  Wasted musculature Skin:  Multiple ecchymotic areas, no edema   LABS:  CBC  Recent Labs Lab 03/29/13 0430 03/30/13 0731 03/31/13 0600  WBC 7.6 8.0 6.9  HGB 13.0 13.5 13.6  HCT 39.9 40.8 40.6  PLT 144* 148* 143*   Coag's No results found for this basename: APTT, INR,  in the last 168 hours BMET  Recent Labs Lab 03/29/13 0430 03/30/13 0731 03/31/13 0600  NA 142 143 143  K 3.5 3.5 3.6  CL 103 103 101  CO2 35* 34* 34*  BUN 13 13 14   CREATININE 0.84 0.80 0.91  GLUCOSE 98 100* 97   Electrolytes  Recent Labs Lab 03/29/13 0430 03/30/13 0731 03/31/13 0600  CALCIUM 9.2 8.9 9.0  MG  --   --  2.6*  PHOS  --   --  3.7   Sepsis Markers No results found for this basename: LATICACIDVEN,  PROCALCITON, O2SATVEN,  in the last 168 hours ABG  Recent Labs Lab 03/28/13 0745 03/28/13 1428 03/30/13 0949  PHART 7.319* 7.421 7.375  PCO2ART 72.0* 52.0* 62.0*  PO2ART 84.0 82.3 49.4*   Liver Enzymes  Recent Labs Lab 03/28/13 0724  AST 13  ALT <5  ALKPHOS 79  BILITOT 0.4  ALBUMIN 3.4*   Cardiac Enzymes  Recent Labs Lab 03/28/13 0724 03/30/13 0731  TROPONINI <0.30  --   PROBNP  --  774.6*   Glucose No results found for this basename: GLUCAP,  in the last 168 hours  Imaging Dg Chest Port 1 View  03/31/2013   CLINICAL DATA:  Evaluate for pulmonary edema  EXAM: PORTABLE CHEST - 1 VIEW  COMPARISON:  Portable chest x-ray of March 30, 2013  FINDINGS: The lungs are well-expanded. The interstitial markings remain minimally prominent but have improved slightly since yesterday's study. The cardiopericardial silhouette is not enlarged. The pulmonary vascularity is prominent centrally but stable. There is mild tortuosity of the descending thoracic aorta. There is no pleural effusion or pneumothorax. The mediastinum is normal in width.  IMPRESSION: There may have been slight interval improvement in the appearance of the minimally increased interstitial markings in  both lungs. There is no evidence of alveolar pneumonia or edema.   Electronically Signed   By: David  [place]   On: 03/31/2013 07:58   Dg Chest Port 1 View  03/30/2013   CLINICAL DATA:  Dyspnea and elevated white blood cell count.  EXAM: PORTABLE CHEST - 1 VIEW  COMPARISON:  Chest x-ray of March 28, 2013.  FINDINGS: The lungs are adequately inflated. The interstitial markings have increased on the left and at the right lung base. The cardiopericardial silhouette is not enlarged. The pulmonary vascularity is prominent centrally though stable. The mediastinum is normal in width. There is no pleural effusion or pneumothorax. The observed portions of the bony thorax exhibit no acute abnormalities.  IMPRESSION: 1. The  interstitial markings are increased throughout much of the left lung and at the right lung base. These findings have increased in conspicuity since the previous study. This may reflect worsening subsegmental atelectasis or interstitial pneumonia. 2. The cardiac silhouette is not enlarged and there is no evidence of pulmonary vascular congestion.   Electronically Signed   By: David  [place]   On: 03/30/2013 11:35     ASSESSMENT / PLAN:  PULMONARY A: Dyspnea  - improved with diuresis  Gold stage 3 COPD - FEV1 42% - MW outpt  P:   No IV steroids at this time, improved after lasix  Continue [place] PRN albuterol/atrovent  O2 as needed Consider swallow evaluation Int f/u CXR  outpt pulm f/u  Would d/c on PO levaquin   CARDIOVASCULAR A: Syncope ? etiology P:  ?r/t dyspnea  Would consider cardiac etiology in setting pulm edema as well  Consider repeat echo, would want to see if has new LF dysfxn, diastolic dysfxn or valvualr dz increasing cont eval need for further diuresis   RENAL A:  No active issue  P:   Chem in am   GASTROINTESTINAL A:  GERD P:   BID PPI  High risk dysphagia - consider speech input   NEUROLOGIC A:  Dementia       Parkinson Dz  P:   Frequent orientation    WHITEHEART,KATHRYN, NP 03/31/2013  11:30 AM Pager: [phone] or [phone]  *Care during the described time interval was provided by me and/or other providers on the critical care team. I have reviewed this patient's available data, including medical history, events of note, physical examination and test results as part of my evaluation.  Taran Hable V.

## 2013-03-31 NOTE — Discharge Summary (Signed)
Addendum  Patient seen and examined, chart and data base reviewed.  I agree with the above assessment and plan.  For full details please see Mrs. Algis Downs PA note.  Acute on chronic respiratory failure, mixed. UTI and AMS, resolved. Discharge home.    Clint Lipps, MD [facility] Pager: [phone] 03/31/2013, 4:56 PM

## 2013-04-05 ENCOUNTER — Telehealth: Payer: Self-pay | Admitting: Family Medicine

## 2013-04-05 MED ORDER — BUDESONIDE 0.5 MG/2ML IN SUSP: 0.5000 mg | Freq: Two times a day (BID) | RESPIRATORY_TRACT | Status: AC

## 2013-04-05 NOTE — Telephone Encounter (Signed)
Patient Information:  Caller Name: Rosey Bath  Phone: [phone]  Patient: [name], [name]  Gender: Male  DOB: [birth year]  Age: 79 Years  PCP: Kelle Darting [facility])  Office Follow Up:  Does the office need to follow up with this patient?: Yes  Instructions For The Office: Office please review request for refill; instructed daughter to check back at pharmacy a little later today. Will comply  RN Note:  Office to handle refill perp ractice orders  Symptoms  Reason For Call & Symptoms: Daughter is calling and states that they need a refill on Budesonide; pt is completely out;  denies any sx; just needs a refill; [facility] [address] [phone]  Reviewed Health History In EMR: Yes  Reviewed Medications In EMR: Yes  Reviewed Allergies In EMR: Yes  Reviewed Surgeries / Procedures: Yes  Date of Onset of Symptoms: 04/05/2013  Guideline(s) Used:  No Protocol Available - Information Only  Disposition Per Guideline:   Discuss with PCP and Callback by Nurse Today  Reason For Disposition Reached:   Nursing judgment  Advice Given:  N/A  Patient Will Follow Care Advice:  YES

## 2013-04-09 ENCOUNTER — Telehealth: Payer: Self-pay | Admitting: Family Medicine

## 2013-04-09 ENCOUNTER — Telehealth: Payer: Self-pay | Admitting: Internal Medicine

## 2013-04-09 ENCOUNTER — Telehealth: Payer: Self-pay | Admitting: Neurology

## 2013-04-09 ENCOUNTER — Inpatient Hospital Stay: Payer: Medicare Other | Admitting: Adult Health

## 2013-04-09 NOTE — Telephone Encounter (Signed)
  Patient's son called. He is asking if Dr. Anne Hahn can call the [employer] to send a message to patient's granddaughter, Michae Grimley. This message much come through a medical provider in order for the [employer] to forward it. The following message is what I have been advised is what needs to be included in the message:  Message to:  [employer] in [place], [place]. Give msg to [employer].  Message: "Your family requests your presence at home for your grandfather".    The following is the [employer] .   If your military family has an emergency need for communication, please call the [employer] at [phone].  The patient's phone number is: [phone]

## 2013-04-09 NOTE — Telephone Encounter (Signed)
Pt 's son calling again can be reached at [phone].[name]

## 2013-04-09 NOTE — Telephone Encounter (Signed)
CAN YOU DO A [employer] CR[employer] MESSAGE TO PATIENTS GRANDDAUGHTER WHO IS IN THE [employer]?

## 2013-04-09 NOTE — Telephone Encounter (Signed)
Pt's son would like to know if dr todd could send a red cross message to the daughter's command asking her to come home b/c pt is not doing well. Daughter is in [place], stationed on [place]. Son was told that any doc could do this. Advised that dr todd out of office but would send message. pls advise.  Was hoping to get message today.

## 2013-04-09 NOTE — Telephone Encounter (Signed)
I called the son. The patient has been in and out of the hospital with COPD exacerbations with CO2 elevations in low oxygen levels. They have requested that the patient's granddaughter be returned from the [employer] to see him, as they fear that his health is failing rapidly. I have called the [employer] and put a request in. The case number is [id number].

## 2013-04-10 ENCOUNTER — Other Ambulatory Visit: Payer: Self-pay | Admitting: Family Medicine

## 2013-04-12 ENCOUNTER — Encounter: Payer: Self-pay | Admitting: Adult Health

## 2013-04-12 ENCOUNTER — Ambulatory Visit (INDEPENDENT_AMBULATORY_CARE_PROVIDER_SITE_OTHER): Payer: Medicare Other | Admitting: Adult Health

## 2013-04-12 VITALS — BP 94/60 | HR 63 | Temp 97.0°F | Ht 69.0 in | Wt 170.0 lb

## 2013-04-12 DIAGNOSIS — J449 Chronic obstructive pulmonary disease, unspecified: Secondary | ICD-10-CM

## 2013-04-12 NOTE — Telephone Encounter (Signed)
lmomtcb for pt's son.   

## 2013-04-12 NOTE — Telephone Encounter (Signed)
Ok per Dr Tawanna Cooler. Left message on machine for son for more information

## 2013-04-12 NOTE — Progress Notes (Signed)
Subjective:     Patient ID: [name], male   DOB: 25-Jun-1926   MRN: [id number]  Brief patient profile:  85  yowm quit smoking 1980  COPD with an FEV1 42% predicted with a ratio of 38% December 05, 2005,   baseline = sob from mb back to house without stopping but once gets to house sit down because it's uphill but can go 10-15 min flat without stopping.    HPI 11/22/2010 ov/Wert cc not able to play golf due to sob. No cough. slt increase in leg swelling all worse x 6 months, indolent onset, minimally progressive. rec Please see patient coordinator before you leave today  to schedule pulmonary rehabilitation and ambulatory 02 at 2lpm > never went to rehab " scheduling issue"   01/03/2011 f/u ov/Wert no rehab yet,  Wearing 02 at hs only. Minimal assoc cough/ sense of congestion. Confused with timing of multiple meds Sleeping ok without nocturnal  or early am exac of resp c/o's or need for noct saba.  >>>changed tenoretic to bystolic     02/08/2013 post hosp f/u ov/Wert re: copd Chief Complaint  Patient presents with  . Follow-up    HFU - impacted bowel and urinary incontincence    On spiriva before and after admit with bowel and urinary dysfunction - sob at rest attributed to abd distention. >>stop spiriva   04/12/2013 Post Hospital follow up  Patient presents for a post hospital followup. He was admitted December 7 through December 10 for a COPD exacerbation an acute on chronic hypercarbic respiratory failure. Patient did require initial BiPAP support in ICU. He was treated with aggressive IV antibiotics, nebulized bronchodilators. His condition was complicated by fluid overload and required diuresis.  He also had a urinary tract infection. That showed enterococcus. Discharged on Augmentin. Patient has a chronic indwelling Foley and is followed by urology. Patient says since discharge. He is improved with decreased cough and shortness of breath. He was seen by urology last week and has  an upcoming appointment sent. Patient continues to have some chronic intermittent confusion. He did undergo a CT head during his hospitalization. That was negative for acute abnormalities. His B12 and ammonia level are normal. He denies any hemoptysis, orthopnea, PND, or increased leg swelling. Patient does have physical therapy at home      Current Medications, Allergies, Complete Past Medical History, Past Surgical History, Family History, and Social History were reviewed in [employer] record.  ROS  The following are not active complaints unless bolded sore throat, dysphagia, dental problems, itching, sneezing,  nasal congestion or excess/ purulent secretions, ear ache,   fever, chills, sweats, unintended wt loss, pleuritic or exertional cp, hemoptysis,  orthopnea pnd or leg swelling, presyncope, palpitations, heartburn, abdominal pain, anorexia, nausea, vomiting, diarrhea  or change in bowel or urinary habits,   dysuria,hematuria,  rash, arthralgias, visual complaints, headache, numbness weakness or ataxia or problems with walking or coordination, , +confusion                    Past Medical History:  GERD (ICD-530.81)  HYPERCHOLESTEROLEMIA (ICD-272.0)  PARKINSON'S DISEASE (ICD-332.0)  HYPERLIPIDEMIA, MIXED (ICD-272.2)  HYPERTENSION (ICD-401.9)  HYPERLIPIDEMIA (ICD-272.4)  HEALTH SCREENING (ICD-V70.0)  BENIGN PROSTATIC HYPERTROPHY (ICD-600.00)  Obesity 5/ 8"  - Target wt = 190 for BMI < 30 , 171 for BMI 26  COPD (ICD-496)  - PFT's 11/2005 FEV1 42% with ratio 38% with no improvement after bdilators with DLCO 73%  -  PFT's 08/31/09 FEV1 47% with ratio 32% with no better after B2 and DLC0 70  - 09/05/07 overnight pulse ox on ra showed 1:46 min with sat <88  - HFA 25> 50 % October 21, 2008 so changed to neb brov/budesonide  - DPI 100% p coaching June 07, 2010  - Repeat overnight sat ordered June 07, 2009 >> adequate sats on 2lpm (5 min < 89)  - Walking  sats RA = ok x 2 laps March 05, 2010> desats p 2 laps 11/22/2010 > resolved on 2lpm --Complex med regimen, med calendar updated 01/17/2011  .  Family History:  Family History of Cardiovascular disorder  neg resp dz/atopy   Social History:  Former Smoker, quit 1980  Alcohol use-yes  Drug use-no  Retired      Objective:   Physical Exam Chronically ill wm more weak than sob  wt 202 October 21, 2008 > 206 June 07, 2009>   04/09/2011  198 > 10/09/2011  198 > 180 02/08/2013 >170 04/12/2013  HEENT mild turbinate edema. Oropharynx no thrush or excess pnd or cobblestoning. No JVD or cervical adenopathy. Mild accessory muscle hypertrophy. Trachea midline, nl thryroid. Chest was hyperinflated by percussion with diminished breath sounds and marked increased exp time without wheeze. Hoover sign positive onset of inspiration. Regular rate and rhythm without murmur gallop or rub or increase P2.  Trace edema bilaterally.  Decrease s1s2 Abd: no hsm, nl excursion. Ext warm without cyanosis or clubbing      03/30/13 slight interval improvement in the appearance of  the minimally increased interstitial markings in both lungs. There is no evidence of alveolar pneumonia or edema.     Assessment:

## 2013-04-12 NOTE — Assessment & Plan Note (Addendum)
Recent COPD, exacerbation, now resolving. Condition was complicated by acute on chronic hypercarbic respiratory failure, enterococcus UTI, and fluid overload. Patient appears to be returning back to his baseline. He is finished all antibiotics.   Plan  Continue on current regimen  follow up Dr. [name]  In 6 weeks and As needed   Please contact office for sooner follow up if symptoms do not improve or worsen or seek emergency care

## 2013-04-12 NOTE — Patient Instructions (Signed)
Continue on current regimen  follow up Dr. [name]  In 6 weeks and As needed   Please contact office for sooner follow up if symptoms do not improve or worsen or seek emergency care

## 2013-04-12 NOTE — Telephone Encounter (Signed)
Pt son called back and reports he no longer needs anything from [place]. Will sign off message

## 2013-04-21 ENCOUNTER — Telehealth: Payer: Self-pay | Admitting: Internal Medicine

## 2013-04-21 NOTE — Telephone Encounter (Signed)
I spoke with Mel, physical therapists with [facility] and he states when he visited the pt that at rest the pt sats were 85% and the pt was having increased SOB as well. He states he advised them to take the pt to ER, so pt daughter is coming over to do this. He did start the pt on an albuterol neb treatment before he left. This is an Financial planner for Dr. [name]. Carron Curie, CMA

## 2013-04-26 ENCOUNTER — Encounter: Payer: Self-pay | Admitting: Neurology

## 2013-04-26 ENCOUNTER — Ambulatory Visit: Payer: Medicare Other | Admitting: Neurology

## 2013-04-26 ENCOUNTER — Ambulatory Visit (INDEPENDENT_AMBULATORY_CARE_PROVIDER_SITE_OTHER): Payer: Medicare Other | Admitting: Neurology

## 2013-04-26 VITALS — BP 102/60 | HR 69

## 2013-04-26 DIAGNOSIS — G20 Parkinson's disease: Secondary | ICD-10-CM

## 2013-04-26 DIAGNOSIS — R413 Other amnesia: Secondary | ICD-10-CM

## 2013-04-26 NOTE — Progress Notes (Signed)
Reason for visit: Parkinson's disease  [name] is an 80 y.o. male  History of present illness:  [name] is an 80 year old left-handed white male with a history of Parkinson's disease, dementia, and a severe COPD. The patient has recently been in the hospital around 03/28/2013 with an exacerbation of his respiratory illness. The patient has chronic CO2 retention, ranging from the 50s to the 70s with the PCO2. The patient continues to have intermittent episodes of hallucinations, usually seeing animals or children in the house. This does not appear to agitate the patient. The patient may have vivid dreams at night. The patient walks inside the house, and he is currently getting physical therapy twice a week in the home. The patient has not had any falls since last seen. The patient does not use a cane or a walker for ambulation. The patient is on home oxygen. The patient is not having issues with swallowing or choking. The patient remains on Stalevo, taking the 150 mg tablets 4 times daily.  Past Medical History  Diagnosis Date  . GERD (gastroesophageal reflux disease)   . Pure hypercholesterolemia   . Paralysis agitans   . Mixed hyperlipidemia   . Unspecified essential hypertension   . Hypertrophy of prostate without urinary obstruction and other lower urinary tract symptoms (LUTS)   . Obesity, unspecified   . Chronic airway obstruction, not elsewhere classified   . Hypertension   . REM sleep behavior disorder   . Memory disorder   . Parkinson disease     Past Surgical History  Procedure Laterality Date  . Hemorrhoid surgery    . Tonsillectomy      Family History  Problem Relation Age of Onset  . Coronary artery disease    . Heart attack Brother   . Heart attack Mother     Social history:  reports that he quit smoking about 35 years ago. His smoking use included Cigarettes. He has a 60 pack-year smoking history. He has never used smokeless tobacco. He reports that  he does not drink alcohol or use illicit drugs.   No Known Allergies  Medications:  Current Outpatient Prescriptions on File Prior to Visit  Medication Sig Dispense Refill  . albuterol (PROVENTIL HFA;VENTOLIN HFA) 108 (90 BASE) MCG/ACT inhaler Inhale 2 puffs into the lungs every 4 (four) hours as needed.  1 Inhaler  0  . albuterol (PROVENTIL) (5 MG/ML) 0.5% nebulizer solution Take 0.5 mLs (2.5 mg total) by nebulization every 3 (three) hours as needed for wheezing or shortness of breath.  20 mL  12  . arformoterol (BROVANA) 15 MCG/2ML NEBU Take 15 mcg by nebulization 2 (two) times daily.      Marland Kitchen aspirin 325 MG tablet Take 325 mg by mouth daily.       . budesonide (PULMICORT) 0.5 MG/2ML nebulizer solution Take 2 mLs (0.5 mg total) by nebulization 2 (two) times daily.  2 mL  5  . BYSTOLIC 5 MG tablet TAKE 1 TABLET BY MOUTH DAILY  90 tablet  0  . carbidopa-levodopa-entacapone (STALEVO) 37.5-150-200 MG per tablet Take 1 tablet by mouth 4 (four) times daily.      . finasteride (PROSCAR) 5 MG tablet Take 5 mg by mouth daily.      . hydrochlorothiazide (HYDRODIURIL) 12.5 MG tablet Take 1 tablet (12.5 mg total) by mouth daily.  90 tablet  3  . magnesium hydroxide (MILK OF MAGNESIA) 400 MG/5ML suspension Take 30 mLs by mouth every Wednesday.      Marland Kitchen  Multiple Vitamin (MULTIVITAMIN) capsule Take 1 capsule by mouth daily.        . nebivolol (BYSTOLIC) 5 MG tablet Take 2.5 mg by mouth daily.       Marland Kitchen omeprazole (PRILOSEC) 40 MG capsule Take 1 capsule (40 mg total) by mouth daily. 30-60 minutes before first meal of the day  30 capsule  3  . polyethylene glycol (MIRALAX / GLYCOLAX) packet Take 17 g by mouth daily.      Marland Kitchen senna (SENOKOT) 8.6 MG TABS tablet Take 1 tablet by mouth daily as needed for mild constipation.      . tamsulosin (FLOMAX) 0.4 MG CAPS capsule Take 1 capsule (0.4 mg total) by mouth daily.       No current facility-administered medications on file prior to visit.    ROS:  Out of a  complete 14 system review of symptoms, the patient complains only of the following symptoms, and all other reviewed systems are negative.  Wheezing, shortness of breath Difficulty urinating, indwelling catheter Daytime sleepiness Memory loss, confusion, tremors, hallucinations Agitation, behavior issues  Blood pressure 102/60, pulse 69, weight 0 lb (0 kg).  Physical Exam  General: The patient is alert and cooperative at the time of the examination.  Skin: No significant peripheral edema is noted. The patient has an indwelling catheter.   Neurologic Exam  Mental status: The patient is alert and cooperative, oriented to person and place, not date.  Cranial nerves: Facial symmetry is present. Speech is normal, no aphasia or dysarthria is noted. Extraocular movements are full, with exception of a superior gaze paresis. Visual fields are full.  Motor: The patient has good strength in all 4 extremities.  Sensory examination: Soft a sensation is symmetric on the face, arms, and legs.  Coordination: The patient has good finger-nose-finger and heel-to-shin bilaterally. The patient has severe apraxia with the use of the arms and the legs. With the arms outstretched, asterixis is seen bilaterally.  Gait and station: The patient requires minimal assistance with standing. Once up, the patient is able to ambulate without assistance, gait is slightly stooped, slow, deliberate. Tandem gait was not attempted. Romberg is negative. No drift is seen.  Reflexes: Deep tendon reflexes are symmetric.   Assessment/Plan:  1. Parkinson's disease  2. Dementia  3. Severe COPD  4. Chronic low grade toxic metabolic encephalopathy  The patient has asterixis on examination suggesting that his CO2 retention likely is causing a low-grade encephalopathy. The patient is quite mobile, able to get up and ambulate fairly well without assistance. I will not alter the prescription dose for the Stalevo. The patient  mainly is limited with his physical activity secondary to his respiratory illness. The patient will followup in 4 months. I will not treat the hallucinations, as they are not usually causing significant issues with agitation.  Jill Alexanders MD 04/26/2013 1:39 PM  [facility] [address] [place], [place] [address]  Phone [phone] Fax [phone]

## 2013-04-26 NOTE — Patient Instructions (Signed)

## 2013-05-05 ENCOUNTER — Emergency Department (HOSPITAL_COMMUNITY): Payer: Medicare Other

## 2013-05-05 ENCOUNTER — Other Ambulatory Visit: Payer: Self-pay

## 2013-05-05 ENCOUNTER — Inpatient Hospital Stay (HOSPITAL_COMMUNITY)
Admission: EM | Admit: 2013-05-05 | Discharge: 2013-05-23 | DRG: 296 | Disposition: E | Payer: Medicare Other | Attending: Internal Medicine | Admitting: Internal Medicine

## 2013-05-05 ENCOUNTER — Encounter (HOSPITAL_COMMUNITY): Payer: Self-pay | Admitting: *Deleted

## 2013-05-05 ENCOUNTER — Telehealth: Payer: Self-pay | Admitting: Internal Medicine

## 2013-05-05 DIAGNOSIS — J438 Other emphysema: Secondary | ICD-10-CM | POA: Diagnosis present

## 2013-05-05 DIAGNOSIS — A419 Sepsis, unspecified organism: Secondary | ICD-10-CM

## 2013-05-05 DIAGNOSIS — K439 Ventral hernia without obstruction or gangrene: Secondary | ICD-10-CM | POA: Diagnosis present

## 2013-05-05 DIAGNOSIS — E875 Hyperkalemia: Secondary | ICD-10-CM | POA: Diagnosis present

## 2013-05-05 DIAGNOSIS — Z9981 Dependence on supplemental oxygen: Secondary | ICD-10-CM

## 2013-05-05 DIAGNOSIS — Z87891 Personal history of nicotine dependence: Secondary | ICD-10-CM

## 2013-05-05 DIAGNOSIS — E870 Hyperosmolality and hypernatremia: Secondary | ICD-10-CM | POA: Diagnosis present

## 2013-05-05 DIAGNOSIS — J9600 Acute respiratory failure, unspecified whether with hypoxia or hypercapnia: Secondary | ICD-10-CM | POA: Diagnosis present

## 2013-05-05 DIAGNOSIS — D72829 Elevated white blood cell count, unspecified: Secondary | ICD-10-CM | POA: Diagnosis present

## 2013-05-05 DIAGNOSIS — I509 Heart failure, unspecified: Secondary | ICD-10-CM | POA: Diagnosis present

## 2013-05-05 DIAGNOSIS — Z515 Encounter for palliative care: Secondary | ICD-10-CM

## 2013-05-05 DIAGNOSIS — G20A1 Parkinson's disease without dyskinesia, without mention of fluctuations: Secondary | ICD-10-CM | POA: Diagnosis present

## 2013-05-05 DIAGNOSIS — J9601 Acute respiratory failure with hypoxia: Secondary | ICD-10-CM

## 2013-05-05 DIAGNOSIS — I469 Cardiac arrest, cause unspecified: Principal | ICD-10-CM

## 2013-05-05 DIAGNOSIS — R579 Shock, unspecified: Secondary | ICD-10-CM | POA: Diagnosis present

## 2013-05-05 DIAGNOSIS — R6521 Severe sepsis with septic shock: Secondary | ICD-10-CM

## 2013-05-05 DIAGNOSIS — Z66 Do not resuscitate: Secondary | ICD-10-CM | POA: Diagnosis present

## 2013-05-05 DIAGNOSIS — I4891 Unspecified atrial fibrillation: Secondary | ICD-10-CM | POA: Diagnosis present

## 2013-05-05 DIAGNOSIS — G20 Parkinson's disease: Secondary | ICD-10-CM | POA: Diagnosis present

## 2013-05-05 HISTORY — DX: Chronic obstructive pulmonary disease, unspecified: J44.9

## 2013-05-05 HISTORY — DX: Parkinson's disease without dyskinesia, without mention of fluctuations: G20.A1

## 2013-05-05 HISTORY — DX: Presence of other specified devices: Z97.8

## 2013-05-05 HISTORY — DX: Shortness of breath: R06.02

## 2013-05-05 HISTORY — DX: Parkinson's disease: G20

## 2013-05-05 HISTORY — DX: Presence of urogenital implants: Z96.0

## 2013-05-05 LAB — GLUCOSE, CAPILLARY
GLUCOSE-CAPILLARY: 290 mg/dL — AB (ref 70–99)
GLUCOSE-CAPILLARY: 246 mg/dL — AB (ref 70–99)
Glucose-Capillary: 229 mg/dL — ABNORMAL HIGH (ref 70–99)
Glucose-Capillary: 172 mg/dL — ABNORMAL HIGH (ref 70–99)

## 2013-05-05 LAB — COMPREHENSIVE METABOLIC PANEL
ALT: 58 U/L — AB (ref 0–53)
AST: 292 U/L — ABNORMAL HIGH (ref 0–37)
Albumin: 2.6 g/dL — ABNORMAL LOW (ref 3.5–5.2)
Alkaline Phosphatase: 78 U/L (ref 39–117)
BUN: 24 mg/dL — ABNORMAL HIGH (ref 6–23)
CO2: 27 meq/L (ref 19–32)
Calcium: 8.5 mg/dL (ref 8.4–10.5)
Chloride: 99 mEq/L (ref 96–112)
Creatinine, Ser: 0.98 mg/dL (ref 0.50–1.35)
GFR calc Af Amer: 85 mL/min — ABNORMAL LOW (ref >90)
GFR, EST NON AFRICAN AMERICAN: 73 mL/min — AB (ref >90)
Glucose, Bld: 277 mg/dL — ABNORMAL HIGH (ref 70–99)
Potassium: 4.8 mEq/L (ref 3.7–5.3)
SODIUM: 148 meq/L — AB (ref 137–147)
TOTAL PROTEIN: 5.2 g/dL — AB (ref 6.0–8.3)
Total Bilirubin: 0.4 mg/dL (ref 0.3–1.2)

## 2013-05-05 LAB — POCT I-STAT 3, ART BLOOD GAS (G3+)
ACID-BASE DEFICIT: 1 mmol/L (ref 0.0–2.0)
BICARBONATE: 22.6 meq/L (ref 20.0–24.0)
Bicarbonate: 30.7 mEq/L — ABNORMAL HIGH (ref 20.0–24.0)
O2 SAT: 100 %
O2 Saturation: 100 %
PO2 ART: 298 mmHg — AB (ref 80.0–100.0)
TCO2: 33 mmol/L (ref 0–100)
TCO2: 24 mmol/L (ref 0–100)
pCO2 arterial: 84.4 mmHg (ref 35.0–45.0)
pCO2 arterial: 34.6 mmHg — ABNORMAL LOW (ref 35.0–45.0)
pH, Arterial: 7.169 — CL (ref 7.350–7.450)
pH, Arterial: 7.423 (ref 7.350–7.450)
pO2, Arterial: 437 mmHg — ABNORMAL HIGH (ref 80.0–100.0)

## 2013-05-05 LAB — POCT I-STAT 3, VENOUS BLOOD GAS (G3P V)
ACID-BASE DEFICIT: 1 mmol/L (ref 0.0–2.0)
Bicarbonate: 29.6 mEq/L — ABNORMAL HIGH (ref 20.0–24.0)
O2 SAT: 98 %
TCO2: 32 mmol/L (ref 0–100)
pCO2, Ven: 82.2 mmHg (ref 45.0–50.0)
pH, Ven: 7.164 — CL (ref 7.250–7.300)
pO2, Ven: 146 mmHg — ABNORMAL HIGH (ref 30.0–45.0)

## 2013-05-05 LAB — CBC
HCT: 40.9 % (ref 39.0–52.0)
Hemoglobin: 12.8 g/dL — ABNORMAL LOW (ref 13.0–17.0)
MCH: 32.7 pg (ref 26.0–34.0)
MCHC: 31.3 g/dL (ref 30.0–36.0)
MCV: 104.6 fL — ABNORMAL HIGH (ref 78.0–100.0)
PLATELETS: 132 10*3/uL — AB (ref 150–400)
RBC: 3.91 MIL/uL — AB (ref 4.22–5.81)
RDW: 14.5 % (ref 11.5–15.5)
WBC: 12.9 10*3/uL — ABNORMAL HIGH (ref 4.0–10.5)

## 2013-05-05 LAB — TROPONIN I
Troponin I: <0.3 ng/mL (ref <0.30)
Troponin I: 0.48 ng/mL (ref <0.30)
Troponin I: 0.9 ng/mL (ref <0.30)

## 2013-05-05 LAB — CG4 I-STAT (LACTIC ACID): LACTIC ACID, VENOUS: 11 mmol/L — AB (ref 0.5–2.2)

## 2013-05-05 LAB — CK TOTAL AND CKMB (NOT AT ARMC)
CK TOTAL: 522 U/L — AB (ref 7–232)
CK, MB: 26.6 ng/mL (ref 0.3–4.0)
Relative Index: 5.1 — ABNORMAL HIGH (ref 0.0–2.5)

## 2013-05-05 LAB — PRO B NATRIURETIC PEPTIDE: Pro B Natriuretic peptide (BNP): 407.4 pg/mL (ref 0–450)

## 2013-05-05 LAB — MRSA PCR SCREENING: MRSA by PCR: NEGATIVE

## 2013-05-05 MED ORDER — SODIUM CHLORIDE 0.9 % IV SOLN
2000.0000 mL | Freq: Once | INTRAVENOUS | Status: AC
  Administered 2013-05-05: 2000 mL via INTRAVENOUS

## 2013-05-05 MED ORDER — INSULIN ASPART 100 UNIT/ML ~~LOC~~ SOLN
0.0000 [IU] | SUBCUTANEOUS | Status: DC
  Administered 2013-05-05: 5 [IU] via SUBCUTANEOUS
  Administered 2013-05-05: 2 [IU] via SUBCUTANEOUS
  Administered 2013-05-05: 3 [IU] via SUBCUTANEOUS

## 2013-05-05 MED ORDER — ATROPINE SULFATE 1 MG/ML IJ SOLN
INTRAMUSCULAR | Status: AC | PRN
  Administered 2013-05-05: 1 mg via INTRAVENOUS

## 2013-05-05 MED ORDER — SODIUM CHLORIDE 0.9 % IV SOLN: 250.0000 mL | INTRAVENOUS | Status: DC | PRN

## 2013-05-05 MED ORDER — SODIUM CHLORIDE 0.9 % IV SOLN: INTRAVENOUS | Status: DC

## 2013-05-05 MED ORDER — EPINEPHRINE HCL 0.1 MG/ML IJ SOSY
PREFILLED_SYRINGE | INTRAMUSCULAR | Status: AC | PRN
  Administered 2013-05-05 (×2): 1 mg via INTRAVENOUS

## 2013-05-05 MED ORDER — CALCIUM CHLORIDE 10 % IV SOLN
INTRAVENOUS | Status: AC | PRN
  Administered 2013-05-05: 1 g via INTRAVENOUS

## 2013-05-05 MED ORDER — ASPIRIN 300 MG RE SUPP
300.0000 mg | RECTAL | Status: AC
  Administered 2013-05-05: 300 mg via RECTAL
  Filled 2013-05-05: qty 1

## 2013-05-05 MED ORDER — MORPHINE SULFATE 10 MG/ML IJ SOLN
10.0000 mg/h | INTRAVENOUS | Status: DC
  Administered 2013-05-05: 10 mg/h via INTRAVENOUS
  Filled 2013-05-05: qty 10

## 2013-05-05 MED ORDER — NOREPINEPHRINE BITARTRATE 1 MG/ML IJ SOLN
0.5000 ug/min | INTRAVENOUS | Status: DC
  Administered 2013-05-05: 5 ug/min via INTRAVENOUS
  Filled 2013-05-05: qty 4

## 2013-05-05 MED ORDER — DEXTROSE 5 % IV SOLN
150.0000 mg | INTRAVENOUS | Status: DC | PRN
  Administered 2013-05-05: 150 mg via INTRAVENOUS

## 2013-05-05 MED ORDER — CISATRACURIUM BOLUS VIA INFUSION
0.1000 mg/kg | Freq: Once | INTRAVENOUS | Status: DC
  Filled 2013-05-05: qty 7

## 2013-05-05 MED ORDER — DOPAMINE-DEXTROSE 1.6-5 MG/ML-% IV SOLN
2.0000 ug/kg/min | Freq: Once | INTRAVENOUS | Status: DC
  Filled 2013-05-05: qty 250

## 2013-05-05 MED ORDER — NOREPINEPHRINE BITARTRATE 1 MG/ML IJ SOLN
5.0000 ug/min | INTRAVENOUS | Status: DC
  Administered 2013-05-05: 35 ug/min via INTRAVENOUS
  Administered 2013-05-05: 28 ug/min via INTRAVENOUS
  Filled 2013-05-05 (×4): qty 8

## 2013-05-05 MED ORDER — BIOTENE DRY MOUTH MT LIQD
15.0000 mL | Freq: Four times a day (QID) | OROMUCOSAL | Status: DC
  Administered 2013-05-05: 15 mL via OROMUCOSAL

## 2013-05-05 MED ORDER — SODIUM CHLORIDE 0.9 % IV SOLN
1.0000 ug/kg/min | INTRAVENOUS | Status: DC
  Filled 2013-05-05: qty 20

## 2013-05-05 MED ORDER — MORPHINE BOLUS VIA INFUSION
5.0000 mg | INTRAVENOUS | Status: DC | PRN
  Filled 2013-05-05: qty 20

## 2013-05-05 MED ORDER — DOPAMINE-DEXTROSE 3.2-5 MG/ML-% IV SOLN: 2.0000 ug/kg/min | Freq: Once | INTRAVENOUS | Status: DC

## 2013-05-05 MED ORDER — ALBUTEROL SULFATE (2.5 MG/3ML) 0.083% IN NEBU: 2.5000 mg | INHALATION_SOLUTION | RESPIRATORY_TRACT | Status: DC | PRN

## 2013-05-05 MED ORDER — CHLORHEXIDINE GLUCONATE 0.12 % MT SOLN
15.0000 mL | Freq: Two times a day (BID) | OROMUCOSAL | Status: DC
  Administered 2013-05-05: 15 mL via OROMUCOSAL
  Filled 2013-05-05: qty 15

## 2013-05-05 MED ORDER — CISATRACURIUM BOLUS VIA INFUSION
0.0500 mg/kg | INTRAVENOUS | Status: DC | PRN
  Filled 2013-05-05: qty 4

## 2013-05-05 MED ORDER — SODIUM BICARBONATE 8.4 % IV SOLN
INTRAVENOUS | Status: AC | PRN
  Administered 2013-05-05: 50 meq via INTRAVENOUS

## 2013-05-05 MED ORDER — FENTANYL CITRATE 0.05 MG/ML IJ SOLN
25.0000 ug | INTRAMUSCULAR | Status: DC | PRN
Start: 2013-05-05 — End: 2013-05-05

## 2013-05-05 MED ORDER — PANTOPRAZOLE SODIUM 40 MG IV SOLR
40.0000 mg | INTRAVENOUS | Status: DC
  Administered 2013-05-05: 40 mg via INTRAVENOUS
  Filled 2013-05-05: qty 40

## 2013-05-05 MED FILL — Medication: Qty: 1 | Status: AC

## 2013-05-06 ENCOUNTER — Other Ambulatory Visit: Payer: Self-pay | Admitting: Neurology

## 2013-05-06 ENCOUNTER — Encounter: Payer: Self-pay | Admitting: Neurology

## 2013-05-06 NOTE — Progress Notes (Signed)
Chaplain responded to request by Nursing Unit to provide pastoral support for family of patient whose medical status was declining. Presented to a great host of family who were at the bedside of patient, offered spiritual support through presence and a listening ear,  prayer as requested by family, and comfort measures.  Spent time allowing family to share memories and life stories as a way to process grief.  Clients medical status based on his medical status and DNR status was removed from life support and patient died.  Family was appreciative of all support for patient. [place], 90 mins [phone]

## 2013-05-06 NOTE — Progress Notes (Signed)
Monitor alarmed for Asystole, This RN with CN went to room and listened for heart sounds for a full minute with none heard. Pt pronounced at 2358. [place] notified. CDS re notified with time of death. Chaplain at bedside. Condolences and Comfort provided to family. No belongings with patient. Embrace hope packet given to pt. Will cont monitor.

## 2013-05-06 NOTE — Progress Notes (Signed)
90 ml Morphine gtt wasted in sink by 2 RN Melene Plan RN & Joneen Boers RN)

## 2013-05-06 NOTE — Telephone Encounter (Signed)
I spoke with the pt son and they no longer need anything at this time. Bing, CMA

## 2013-05-10 ENCOUNTER — Telehealth: Payer: Self-pay | Admitting: Family Medicine

## 2013-05-10 ENCOUNTER — Telehealth: Payer: Self-pay | Admitting: Internal Medicine

## 2013-05-10 NOTE — Telephone Encounter (Signed)
Daughter called to let you know that pt has passed.

## 2013-05-10 NOTE — Telephone Encounter (Signed)
I was already aware as I was contacted by Dr Titus Mould - send condolence card

## 2013-05-11 NOTE — Telephone Encounter (Signed)
Dr Todd notified. 

## 2013-05-20 NOTE — Discharge Summary (Signed)
[name], AMBROCIO NO.:  [id number]  MEDICAL RECORD NO.:  [id number]  LOCATION:  [id number]                        FACILITY:  [facility]  PHYSICIAN:  [name], MD DATE OF BIRTH:  10-14-1926  DATE OF ADMISSION:  05/04/2013 DATE OF DISCHARGE:  05/06/2013                              DISCHARGE SUMMARY   DEATH SUMMARY  This is an 80 year old, Caucasian male with a history of COPD severe with Parkinson's and emphysema seen by Dr. Leonides Schanz in the office for years. The patient declining over the past several months with multiple hospital admissions and readmissions for aspiration pneumonia and concerns of COPD exacerbation.  The patient essentially coded at home, was unresponsive.  The family initiated CPR.  EMS was continued, brought to the ED with return of spontaneous circulation after 25 minutes of down time requiring pacing.  Hospital course included a cardiac arrest at home.  He had recurrent arrest in the emergency room totalling over 30 minutes plus of resuscitation efforts.  The patient was able to resuscitate back to out of arrhythmia.  He had an endotracheal tube placed.  He had a right IJ CVL placed.  He had a right IJ Cordis placed with temporary wire, pulled down by Cardiology.  A left femoral A-line placed during resuscitation.  Discussions were had with the patient's family members, fully aware of his decline over the past several months. His wishes were not to be continued in a circumstance like this, family understood.  We had maximized our efforts with pressors and mechanical ventilation, and a poor prognosis with terminal lung disease in the setting of Parkinson disease and now a prolonged cardiac arrest and severe multiorgan failure and likely anoxic brain injury.  Family elected for the option of withdrawal of life-sustaining measures, and the patient expired.  FINAL DIAGNOSES UPON DEATH: 1. Severe anoxic brain injury status post extensive  cardiac arrest. 2. Severe chronic obstructive pulmonary disease. 3. Bradyarrhythmia secondary to cardiac arrest. 4. Multiorgan dysfunction syndrome with shock status post cardiogenic     shock and cardiac arrest. 5. Hypernatremia.     [name], MD     DJF/MEDQ  D:  05/19/2013  T:  05/20/2013  Job:  [id number]

## 2013-05-23 NOTE — Progress Notes (Signed)
Called to bedside by [facility]-Link MD to discuss goals of care.  All family available at bedside and updated on patients current medical status.  Wife indicates he would not want prolonged artifical support.  Explained the process of withdrawal of mechanical ventilation and comfort medications.  Family indicate verbal understanding.  RN present for conversation.     Plan: -withdrawal of care orders in place -initiate morphine gtt, titrate for normal resp rate -after 30 minutes of morphine gtt and family ready, withdrawal mechanical vent -DNR  Arthur Gens, NP-C [facility] Pgr: [phone] or [phone]

## 2013-05-23 NOTE — ED Notes (Signed)
Pt to department via EMS-pt in the bed this morning asleep with wife. Pt became unresponsive and the compressions started by wife. Total of 25 minutes of CPR. Pt given a total of 5 epi's, 2 narcan D10, 500 NS, pt shocked times x1 in v-fib. Pt then in a NSR rhythm and then to bradycardia. Pt arrived paced at a rate of 60.

## 2013-05-23 NOTE — Code Documentation (Signed)
Pulse check, - compressions continued.

## 2013-05-23 NOTE — ED Notes (Signed)
Paced at a rate of 60 and milliamps of 160.

## 2013-05-23 NOTE — ED Provider Notes (Signed)
CSN: [id number]     Arrival date & time May 06, 2013  1013 History   First MD Initiated Contact with Patient 05-06-13 1026     Chief Complaint  Patient presents with  . Cardiac Arrest   (Consider location/radiation/quality/duration/timing/severity/associated sxs/prior Treatment) HPI Comments: 80 year old white male presents emergency department via EMS status post cardiac arrest. Patient was found by his wife in bed unresponsive, pulseless, apneic. She initiated CPR for about 5 minutes. EMS arrived on scene and continued CPR for approximately 25 minutes. ACLS code was initiated by EMS crew. The patient received 5 doses of epinephrine, Narcan 2 mg, IV glucose, normal saline bolus, defibrillation x1. IV access was obtained in the left upper extremity and 2 nonfunctional IOC were placed in bilateral lower extremities. Pulse was regained. On transport to the hospital the patient became bradycardic and EMS crew initiated transcutaneous pacing.  Upon arrival to the emergency department patient was being paced transcutaneously and he was transferred to bed and trauma B. Room.  History and past history limited due to patient's condition.  Past medical history based on review of medical records include COPD, shortness of breath, Parkinson's disease, dementia, indwelling Foley catheter.  Patient is a 80 y.o. male presenting with syncope. The history is provided by the patient.  Loss of Consciousness Episode history:  Single Most recent episode:  Today Duration:  30 minutes Timing:  Constant Progression:  Worsening Chronicity:  New Witnessed: yes   Worsened by:  Nothing tried Associated symptoms comment:  Unable to, and based on lack of history.   Past Medical History  Diagnosis Date  . Dementia   . COPD (chronic obstructive pulmonary disease)   . Shortness of breath   . Parkinson's disease   . Chronic indwelling Foley catheter    History reviewed. No pertinent past surgical history. History  reviewed. No pertinent family history. History  Substance Use Topics  . Smoking status: Former Research scientist (life sciences)  . Smokeless tobacco: Not on file  . Alcohol Use: Not on file    Review of Systems  Unable to perform ROS: Acuity of condition  Cardiovascular: Positive for syncope.    Allergies  Review of patient's allergies indicates no known allergies.  Home Medications  No current outpatient prescriptions on file. BP 114/53  Pulse 25  Temp(Src) 92 F (33.3 C) (Oral)  Resp 24  Ht 5\' 10"  (1.778 m)  Wt 170 lb 10.2 oz (77.4 kg)  BMI 24.48 kg/m2  SpO2 100% Physical Exam  Nursing note and vitals reviewed. Constitutional: No distress. He is sedated and chemically paralyzed. Backboard in place.  Patient is a cachectic 80 year old male in acute distress. He is intubated, been paced transcutaneously, and sedated and paralyzed.  HENT:  Head: Normocephalic and atraumatic.  Right Ear: No hemotympanum.  Left Ear: No hemotympanum.  Nose: Nose normal.  HEENT: Normocephalic atraumatic, no evidence of trauma, pupils fixed at approximately 3 mm, no evidence of battle signs or hemotympanum, patient is intubated  Eyes: Conjunctivae are normal.  Neck: Normal range of motion. Neck supple.  Cardiovascular: Normal rate and regular rhythm.  Exam reveals no friction rub.   No murmur heard. Pulmonary/Chest: He has decreased breath sounds.  Diffuse diminished breath sounds bilaterally. Patient intubated. No epigastric sounds on bagging.  Abdominal: Soft. He exhibits distension. He exhibits no shifting dullness, no pulsatile liver, no fluid wave, no abdominal bruit and no ascites. Bowel sounds are decreased. There is no tenderness. There is no rigidity, no rebound, no guarding, no CVA tenderness, no  tenderness at McBurney's point and negative Murphy's sign.  Epigastric abdominal distention noted.  Genitourinary: Right testis shows no mass and no tenderness. Left testis shows no mass and no tenderness.  Circumcised.  Indwelling Foley present  Musculoskeletal:  Bilateral tibial plateau IO's in place, nonfunctioning     ED Course  External pacer Date/Time: 05/01/2013 6:29 PM Performed by: Curly Rim, DAVID Authorized by: Curly Rim, DAVID Consent: The procedure was performed in an emergent situation. Risks and benefits: risks, benefits and alternatives were discussed Patient understanding: patient does not state understanding of the procedure being performed Patient consent: the patient's understanding of the procedure does not match consent given Procedure consent: procedure consent does not match procedure scheduled Patient identity confirmed: anonymous protocol, patient vented/unresponsive Comments: Transcutaneous pacing performed based on symptomatic bradycardia.  Patient had good capture with a rate of 60 beats per minute at 140 mg   (including critical care time)    EMERGENCY DEPARTMENT [place] CARDIAC EXAM "Study: Limited Ultrasound of the heart and pericardium"  INDICATIONS:Cardiac arrest Multiple views of the heart and pericardium are obtained with a multi-frequency probe.  PERFORMED ZO:[id number]  IMAGES ARCHIVED?: No  FINDINGS: No pericardial effusion and Normal contractility  LIMITATIONS:  Emergent procedure  VIEWS USED: Subcostal 4 chamber  INTERPRETATION: Cardiac activity present and Pericardial effusioin absent  COMMENT:  No pericardial effusion noted, good contraction with transcutaneous pacing.  CRITICAL CARE Performed by: Curly Rim, DAVID  Total critical care time: 45  Critical care time was exclusive of separately billable procedures and treating other patients.  Critical care was necessary to treat or prevent imminent or life-threatening deterioration.  Critical care was time spent personally by me on the following activities: development of treatment plan with patient and/or surrogate as well as nursing, discussions with consultants, evaluation of patient's  response to treatment, examination of patient, obtaining history from patient or surrogate, ordering and performing treatments and interventions, ordering and review of laboratory studies, ordering and review of radiographic studies, pulse oximetry and re-evaluation of patient's condition.    Labs Review Labs Reviewed  CBC - Abnormal; Notable for the following:    WBC 12.9 (*)    RBC 3.91 (*)    Hemoglobin 12.8 (*)    MCV 104.6 (*)    Platelets 132 (*)    All other components within normal limits  COMPREHENSIVE METABOLIC PANEL - Abnormal; Notable for the following:    Sodium 148 (*)    Glucose, Bld 277 (*)    BUN 24 (*)    Total Protein 5.2 (*)    Albumin 2.6 (*)    AST 292 (*)    ALT 58 (*)    GFR calc non Af Amer 73 (*)    GFR calc Af Amer 85 (*)    All other components within normal limits  GLUCOSE, CAPILLARY - Abnormal; Notable for the following:    Glucose-Capillary 229 (*)    All other components within normal limits  TROPONIN I - Abnormal; Notable for the following:    Troponin I 0.48 (*)    All other components within normal limits  CK TOTAL AND CKMB - Abnormal; Notable for the following:    Total CK 522 (*)    CK, MB 26.6 (*)    Relative Index 5.1 (*)    All other components within normal limits  GLUCOSE, CAPILLARY - Abnormal; Notable for the following:    Glucose-Capillary 290 (*)    All other components within normal limits  GLUCOSE, CAPILLARY - Abnormal;  Notable for the following:    Glucose-Capillary 246 (*)    All other components within normal limits  POCT I-STAT 3, BLOOD GAS (G3P V) - Abnormal; Notable for the following:    pH, Ven 7.164 (*)    pCO2, Ven 82.2 (*)    pO2, Ven 146.0 (*)    Bicarbonate 29.6 (*)    All other components within normal limits  CG4 I-STAT (LACTIC ACID) - Abnormal; Notable for the following:    Lactic Acid, Venous 11.00 (*)    All other components within normal limits  POCT I-STAT 3, BLOOD GAS (G3+) - Abnormal; Notable for the  following:    pH, Arterial 7.169 (*)    pCO2 arterial 84.4 (*)    pO2, Arterial 437.0 (*)    Bicarbonate 30.7 (*)    All other components within normal limits  POCT I-STAT 3, BLOOD GAS (G3+) - Abnormal; Notable for the following:    pCO2 arterial 34.6 (*)    pO2, Arterial 298.0 (*)    All other components within normal limits  MRSA PCR SCREENING  TROPONIN I  PRO B NATRIURETIC PEPTIDE  TROPONIN I  TROPONIN I  URINALYSIS, ROUTINE W REFLEX MICROSCOPIC   Imaging Review Dg Chest Portable 1 View  05/09/2013   CLINICAL DATA:  Central line placement.  EXAM: PORTABLE CHEST - 1 VIEW  COMPARISON:  05/21/2013 at 1120 hr  FINDINGS: New right internal jugular central venous line has its tip projecting in the mid superior vena cava. No gross pneumothorax is noted on the supine study.  Support apparatus described on the earlier study is stable. Lungs remain hyperexpanded with no convincing acute change.  IMPRESSION: Right internal jugular central venous line tip lies in the mid superior vena cava. No pneumothorax. No other change from the earlier study.   Electronically Signed   By: Lajean Manes M.D.   On: 05/01/2013 12:38   Dg Chest Portable 1 View  04/26/2013   CLINICAL DATA:  80 year old male status post cardiac arrest. Initial encounter.  EXAM: PORTABLE CHEST - 1 VIEW  COMPARISON:  1025 hr the same day.  FINDINGS: Portable AP supine view at 1120 hrs. Resuscitation pads EKG leads and wires overlie the chest. Large lung volumes. Skin fold artifact suspected on the right. No definite pneumothorax.  Gaseous distension of the stomach. Suspicious crescentic lucency in the left upper quadrant. No pulmonary edema. No consolidation. Normal cardiac size and mediastinal contours.  Endotracheal tube tip in good position between the level the clavicles and carina.  IMPRESSION: 1. Endotracheal tube in good position. Large lung volumes. No acute cardiopulmonary abnormality. 2. Gaseous distension of the stomach.  Difficult to exclude pneumoperitoneum. Left-side-down lateral decubitus view of the abdomen should be confirmatory.  Study discussed by telephone with Dr. Curly Rim in the ED on 04/23/2013 at 11:50 .   Electronically Signed   By: Lars Pinks M.D.   On: 04/23/2013 11:50   Dg Chest Port 1 View  05/01/2013   CLINICAL DATA:  Orogastric tube placement  EXAM: PORTABLE CHEST - 1 VIEW  COMPARISON:  Portable exam 1025 hr repeated at 1026 hr without priors for comparison  FINDINGS: Artifacts from trauma board, external pacing leads and multiple EKG leads.  A tube is identified at the cervical region into the upper thorax, question endotracheal versus large-bore orogastric tube, tip at the level of the aortic arch above carina.  Normal heart size, mediastinal contours, and pulmonary vascularity.  Lungs are emphysematous but clear.  No  gross pleural effusion or pneumothorax.  Gaseous distention of stomach noted.  IMPRESSION: Limited exam due to extensive superimposed artifacts.  Question endotracheal versus large-bore orogastric tube with tip projecting at the level aortic arch, above carina.  No acute infiltrate.  Gaseous distention of stomach.   Electronically Signed   By: Lavonia Dana M.D.   On: 28-May-2013 10:48   Dg Abd Portable 1v  05-28-2013   CLINICAL DATA:  Evaluate for possible free air.  EXAM: PORTABLE ABDOMEN - 1 VIEW  COMPARISON:  Chest radiograph, 28-May-2013 at 1120 hr.  FINDINGS: There are scattered air-fluid levels within nondistended bowel suggesting a mild adynamic ileus. There is no free air.  IMPRESSION: No free air.   Electronically Signed   By: Lajean Manes M.D.   On: May 28, 2013 12:39   EKG:  Date: 28-May-2013  Rate: 89  Rhythm: atrial fibrillation  QRS Axis: normal  Intervals: normal  ST/T Wave abnormalities: nonspecific ST changes  Conduction Disutrbances:none  Narrative Interpretation: diffuse STS changes  Old EKG Reviewed: changes noted    MDM   1. Cardiac arrest   2. Cardiac failure    3. Acute respiratory failure with hypoxia   4. Septic shock(18.20)    80 year old white male with past medical history of dementia, COPD, shortness of breath, Parkinson disease, chronic indwelling Foley catheter presents emergency department status post cardiac arrest.  Patient at approximately 25 minutes on file with CPR and extensive ACLS resuscitation was performed by EMS. Upon arrival patient was intubated, sedated, paralyzed, and transcutaneously paced.  Stat pages were placed to pulmonary critical care and cardiology service. Joint resuscitation via phone critical care and cardiology was performed. Patient had a prolonged stay in the emergency department and at one point became pulseless and CPR was initiated.  Access was obtained via peripheral IVs. CVL and Aline were placed by pulmonary critical care, left subclavian Cordis with pacer placement by cardiology.  Transvenous pacing ensued.    Family updated and regarding patient's critical condition. Chaplain, critical care PA student, and myself had a lengthy discussion with family updating them on the critical condition and poor prognosis of the patient. All questions were answered.  Patient transferred to the ICU.    Elmer Sow, MD 05/28/13 [phone]

## 2013-05-23 NOTE — Procedures (Signed)
Central Venous Catheter Insertion Procedure Note [name] [id number] 1927-03-31  Procedure: Insertion of Central Venous Catheter and Temporary pacemaker wire Indications: Asystole/cardiopulmonary arrest  Procedure Details Consent: Unable to obtain consent because of emergent medical necessity. Time Out: Verified patient identification, verified procedure, site/side was marked, verified correct patient position, special equipment/implants available, medications/allergies/relevent history reviewed, required imaging and test results available.  Performed  Maximum sterile technique was used including antiseptics, gown, hand hygiene and sheet. Skin prep: Chlorhexidine; local anesthetic administered A antimicrobial bonded/coated single lumen catheter was placed in the left internal jugular vein using the Seldinger technique. A balloon tipped temporary pacemaker wire was advanced until ventricular capture was confirmed. Adequate pacing threshold (1 V) and sensing were confirmed. Sheath secured to skin.  Evaluation Complications: No apparent complications Patient did tolerate procedure well. Chest X-ray ordered to verify placement.  CXR: pending.  [name] May 24, 2013, 12:20 PM

## 2013-05-23 NOTE — ED Notes (Signed)
Pacer wire placed by Cardiology. X-ray called.

## 2013-05-23 NOTE — Procedures (Signed)
Arterial Catheter Insertion Procedure Note [name] [id number] 1926-09-01  Procedure: Insertion of Arterial Catheter  Indications: Blood pressure monitoring and Frequent blood sampling  Procedure Details Consent: Unable to obtain consent because of emergent medical necessity. Time Out: Verified patient identification, verified procedure, site/side was marked, verified correct patient position, special equipment/implants available, medications/allergies/relevent history reviewed, required imaging and test results available.  Performed  Maximum sterile technique was used including antiseptics, cap, gloves, gown, hand hygiene, mask and sheet. Skin prep: Chlorhexidine; local anesthetic administered 20 gauge catheter was inserted into left femoral artery using the Seldinger technique.  Evaluation Blood flow good; BP tracing good. Complications: No apparent complications.   [name] 05/20/2013   [place]  [place] Titus Mould, MD, [place] Pgr: [facility]

## 2013-05-23 NOTE — Procedures (Signed)
Extubation Procedure Note  Patient Details:   Name: [name] DOB: April 15, 1927 MRN: [id number]    Pt terminally extubated per MD order. RN and family at bedside.  Evaluation  O2 sats: stable throughout Complications: No apparent complications Patient did tolerate procedure well. Bilateral Breath Sounds: Diminished Suctioning: Airway  Doroteo Bradford 05/08/2013, 11:40 PM

## 2013-05-23 NOTE — Procedures (Signed)
cpr note In ED Code PEA to self resolved VT ACLS followed Treated empiric hyperK A line placed  Back to [place]. Titus Mould, MD, [place] Pgr: [place] [facility]

## 2013-05-23 NOTE — ED Notes (Signed)
Family at bedside. 

## 2013-05-23 NOTE — ED Notes (Signed)
Istat lactic acid and istat blood gas shown to Dr Curly Rim and Ria Comment, RN

## 2013-05-23 NOTE — H&P (Signed)
Name: [name] MRN: [id number] DOB: [birth year]    ADMISSION DATE:  06-03-2013 CONSULTATION DATE:  2013/06/03  REFERRING MD :  Curly Rim PRIMARY SERVICE: Critical Care  CHIEF COMPLAINT:  Cardiac Arrest at home.  BRIEF PATIENT DESCRIPTION: 80 y.o Caucasian male with history of COPD, Parkinson's, and Emphysema.  Wife awoke this morning to find patient unresponsive with thready pulse.  Family initiated CPR, EMS continued.  Brought to ED with ROSC after ~25 mins down time requiring pacing.  SIGNIFICANT EVENTS / STUDIES:  1/14 >>> Cardiac arrest, Code blue x2, CPR > 9mins total.  Cardio consult. Pacer placed  LINES / TUBES: 1/14 >>ETT 1/14 >> R IJ CVC 1/14 >> L IJ Cortis, temp pacer 1/14 >> L femoral A-line  CULTURES: None.  ANTIBIOTICS: None.  HISTORY OF PRESENT ILLNESS:  Pt found down by wife this AM.  CPR 5 mins by family before EMS took over. Total ~25 mins down time initally. After ROSC bradycardia occurred to rate of high 40s-low 50s, pacing initiated via transcutaneous pads in route to ED. Upon arrival, critical care and cardiology consulted.  Pt lost pulse and CPR was reinitiated. Pressors initiated. ROSC obtained and temporary pacer, femoral a-line, and R ij cvc placed. Pt stabilized for transfer to ICU.  PAST MEDICAL HISTORY :  Emphysema, COPD, Parkinson's, SBO and significant urinary infection in 10/14 requiring hospitalization. Prior to Admission medications   Not on File   Allergies not on file  FAMILY HISTORY:  No family history on file. SOCIAL HISTORY:  has no tobacco, alcohol, and drug history on file.  REVIEW OF SYSTEMS:  Unable, pt intubated and unresponsive.  SUBJECTIVE: Pt unresponsive and not requiring sedation post CPR.  VITAL SIGNS: Temp:  [94.9 F (34.9 C)] 94.9 F (34.9 C) (01/14 1026) Pulse Rate:  [60-131] 96 (01/14 1215) Resp:  [16-31] 31 (01/14 1230) BP: (86-148)/(50-91) 132/91 mmHg (01/14 1230) SpO2:  [99 %-100 %] 99 % (01/14  1215) Arterial Line BP: (126-188)/(78-111) 142/97 mmHg (01/14 1230) FiO2 (%):  [60 %] 60 % (01/14 1020) Weight:  [70 kg (154 lb 5.2 oz)] 70 kg (154 lb 5.2 oz) (01/14 1100) HEMODYNAMICS:   VENTILATOR SETTINGS: Vent Mode:  [-] PRVC FiO2 (%):  [60 %] 60 % Set Rate:  [14 bmp-35 bmp] 35 bmp Vt Set:  [600 mL] 600 mL PEEP:  [5 cmH20] 5 cmH20 Plateau Pressure:  [21 cmH20] 21 cmH20 INTAKE / OUTPUT: Intake/Output   None     PHYSICAL EXAMINATION: General:  Disheveled, sickly eldery male. Neuro:  +Gag, + corneals, no movement of extremities to pain HEENT:  ETT in place. PERRLA pinpoint pupils Cardiovascular:  Irregular, requiring occ pacing,  Lungs:  Bilateral distant coarse breath sounds Abdomen:  Ventral hernia, +BS, abd soft, distended Musculoskeletal:  No edema Skin:  Intact, pulses +2  LABS:  CBC  Recent Labs Lab 06-03-13 1026  WBC 12.9*  HGB 12.8*  HCT 40.9  PLT 132*   Coag's No results found for this basename: APTT, INR,  in the last 168 hours BMET  Recent Labs Lab 06/03/13 1026  NA 148*  K 4.8  CL 99  CO2 27  BUN 24*  CREATININE 0.98  GLUCOSE 277*   Electrolytes  Recent Labs Lab 2013/06/03 1026  CALCIUM 8.5   Sepsis Markers  Recent Labs Lab 06-03-2013 1037  LATICACIDVEN 11.00*   ABG  Recent Labs Lab 06-03-13 1054 03-Jun-2013 1228  PHART 7.169* 7.423  PCO2ART 84.4* 34.6*  PO2ART 437.0* 298.0*  Liver Enzymes  Recent Labs Lab 04/22/2013 1026  AST 292*  ALT 58*  ALKPHOS 78  BILITOT 0.4  ALBUMIN 2.6*   Cardiac Enzymes  Recent Labs Lab 05/09/2013 1026  TROPONINI <0.30  PROBNP 407.4   Glucose  Recent Labs Lab 05/01/2013 1046  GLUCAP 229*    Imaging Dg Chest Portable 1 View  04/26/2013   CLINICAL DATA:  80 year old male status post cardiac arrest. Initial encounter.  EXAM: PORTABLE CHEST - 1 VIEW  COMPARISON:  1025 hr the same day.  FINDINGS: Portable AP supine view at 1120 hrs. Resuscitation pads EKG leads and wires overlie the  chest. Large lung volumes. Skin fold artifact suspected on the right. No definite pneumothorax.  Gaseous distension of the stomach. Suspicious crescentic lucency in the left upper quadrant. No pulmonary edema. No consolidation. Normal cardiac size and mediastinal contours.  Endotracheal tube tip in good position between the level the clavicles and carina.  IMPRESSION: 1. Endotracheal tube in good position. Large lung volumes. No acute cardiopulmonary abnormality. 2. Gaseous distension of the stomach. Difficult to exclude pneumoperitoneum. Left-side-down lateral decubitus view of the abdomen should be confirmatory.  Study discussed by telephone with Dr. Curly Rim in the ED on 05/17/2013 at 11:50 .   Electronically Signed   By: Lars Pinks M.D.   On: 05/22/2013 11:50   Dg Chest Port 1 View  05/22/2013   CLINICAL DATA:  Orogastric tube placement  EXAM: PORTABLE CHEST - 1 VIEW  COMPARISON:  Portable exam 1025 hr repeated at 1026 hr without priors for comparison  FINDINGS: Artifacts from trauma board, external pacing leads and multiple EKG leads.  A tube is identified at the cervical region into the upper thorax, question endotracheal versus large-bore orogastric tube, tip at the level of the aortic arch above carina.  Normal heart size, mediastinal contours, and pulmonary vascularity.  Lungs are emphysematous but clear.  No gross pleural effusion or pneumothorax.  Gaseous distention of stomach noted.  IMPRESSION: Limited exam due to extensive superimposed artifacts.  Question endotracheal versus large-bore orogastric tube with tip projecting at the level aortic arch, above carina.  No acute infiltrate.  Gaseous distention of stomach.   Electronically Signed   By: Lavonia Dana M.D.   On: 05/16/2013 10:48    CXR: Tube in place. No obvious infiltrate. Chronic emphysematous changes seen, lines wnl  ASSESSMENT / PLAN:  PULMONARY A: Respiratory failure. Hx of COPD, Emphysema P:   Maintain vent support ABg reviewed and  rate was increased abg repeat, fio2 to 3% Would NOT escalate vent in future Copd, likely we need to reduce rate further as auto-peep risk  CARDIOVASCULAR A: Cardiac arrest, r/o resp leading to cardiac, pacer dependence, r/o ischemia P:  Cardiology Consult. Maintain tele Levophed to MAP goal 60, we are maxed at [place] placed, rate to 80 in shock Echo per cards No escallation  RENAL A:  Hypernatremia, Likely brain edema, high risk ATN P:   Tolerate Na 154 Use saline still bmet in am if continue support  GASTROINTESTINAL A:  Gastric distention s/p bag mask during CPR, r/o free air, Shock liver P:   Place OG for gastric decompression as needed. Decub film for free air, if free air, would lend itself to comfort as well, NOT surgical candidate ppi lft in am   HEMATOLOGIC A:  Hgb stable. Leukocytosis. P:  scd  INFECTIOUS A:  Leukocytosis., Perm Indwelling foley likely source. P:   No antibiotics, planning withdrawal likely Assess  ua  ENDOCRINE A:  Relative AI, High risk increased sugars. P:   Minimal interventions at this point. Goal cbg 140-180  NEUROLOGIC A:  GCS 3 on no sedation, likely significant anoxic brain injury.  P:   Monitor and begin sedation PRN to keep patient comfortable. I have had extensive discussions with family wife, kids We discussed patients current circumstances and organ failures. We also discussed patient's prior wishes under circumstances such as this. Family has decided to NOT perform resuscitation if arrest but to continue current medical support for now. likely WD support in evening, 2 other kids may come from [place]: Unresponsive at home CPR x2. ROSC with intubation, pressors, and temp pacer.  Pt now made DNR. Will maintain current measures without adding more aggressive treatments.  Awaiting family from out of town for planned withdrawal later tonight.   I have personally obtained a history,  examined the patient, evaluated laboratory and imaging results, formulated the assessment and plan and placed orders. CRITICAL CARE: The patient is critically ill with multiple organ systems failure and requires high complexity decision making for assessment and support, frequent evaluation and titration of therapies, application of advanced monitoring technologies and extensive interpretation of multiple databases. Critical Care Time devoted to patient care services described in this note is 120 minutes.    Melissa A Holmes PA-S  [place]pe Titus Mould, MD, [place] Pgr: [facility]  Pulmonary and [place] Pager: [phone]  2013-05-17, 12:19 PM

## 2013-05-23 NOTE — Progress Notes (Signed)
RT note- received patient from ED, orders to decrease rate of ventilator to 24.

## 2013-05-23 NOTE — Consult Note (Signed)
CARDIOLOGY CONSULT NOTE   Patient ID: [name] MRN: [id number] Primary MRN:  [id number] DOB/AGE: 1926/11/06 80 y.o.  Admit date: 04/24/2013  Primary Physician   Dr. Stevie Kern, Dr. Christinia Gully, Dr. Jannifer Franklin Primary Cardiologist   New Reason for Consultation   Cardiac arrest  CBJ:SEGBTD [name] is a 80 y.o. male with no history of CAD.  His mental status and respiratory status has been deteriorating over the last 3 months. He is on chronic oxygen and can only walk around a bit in the house. He had a syncopal episode 4 days ago, very brief, no prodrome, woke up in seconds. No seizure activity, no sequelae. He was not evaluated.  This am, his wife noted him to be wheezing and more SOB than usual. She gave him 2 albuterol treatments, but he did not improve. She called EMS just after 9 am and called family as well. He did not complain of any chest pain.  He was a CPR on EMS arrival, requiring defibrillation, intubation and medications. He recieved a total of 25 minutes of CPR. Pt given a total of 5 epi's, 2 narcan D10, 500 NS, pt shocked times x1 in v-fib. Pt then in a NSR rhythm and then to bradycardia. Pt arrived paced at a rate of 60.   He lost pulses and CPR was restarted. He required Epi x 2, atropine x 1, CaCl x 1, bicarb x 1 and dopamine. He got pulses back and the dopamine was d/c'd, but went into a WCT and got shocked x 1, then amio bolus 150 mg and levophed for pressure support.   Past medical history on file under MRN [id number] Includes COPD on home O2, Syncope 01/2013, HTN, HL, Parkinsons and ?dementia.   Past surgical history on file under MRN [id number]  Allergies:  none  I have reviewed the patient's current medications . aspirin  300 mg Rectal NOW   . amiodarone (NEXTERONE) IV bolus only 150 mg/100 mL    . cisatracurium     And  . cisatracurium (NIMBEX) infusion     And  . cisatracurium    . DOPamine    . norepinephrine (LEVOPHED) Adult infusion 20  mcg/min (04/26/2013 1115)   amiodarone (NEXTERONE) IV bolus only 150 mg/100 mL, cisatracurium  Prior to Admission medications   See MRN [id number] Includes albuterol, pulmicort, bystolic     History   Social History  . Marital Status: Married    Spouse Name: Arthur/A    Number of Children: Arthur/A  . Years of Education: Arthur/A   Occupational History  . Retired Therapist, occupational and [employer]   Social History Main Topics  . Smoking status: 60 pack-years, quit 1980  . Smokeless tobacco: No   . Alcohol Use: No   . Drug Use: No   . Sexual Activity: No    Other Topics Concern  . Not on file   Social History Narrative  . Lives with wife of 36 years    Family status: Both parents deceased, Possible CAD in mother, none in father. Brother alive with Hx CABG     ROS:  Not able to be obtained secondary to pt condition.  Physical Exam: Blood pressure 110/64, pulse 104, temperature 94.9 F (34.9 C), temperature source Rectal, resp. rate 30, weight 154 lb 5.2 oz (70 kg), SpO2 100.00%.  General: Well developed, elderly, male unresponsive post-CPR Head: Eyes non-reactive, No xanthomas.   Normocephalic and atraumatic, oropharynx without edema or exudate. Dentition: poor  Lungs: bilateral rales Heart: HRRR S1 S2, no rub/gallop, Femoral pulses are 2+    Neck: No carotid bruits. No lymphadenopathy.  JVD not able to assess due to equipment. Abdomen: Bowel sounds present, abdomen soft and non-tender without masses or hernias noted. Msk: No joint deformities or effusions. Extremities: No clubbing or cyanosis. No edema.  Neuro: Unresponsive on the vent Skin: No rashes or lesions noted.  Labs:   ABG    Component Value Date/Time   PHART 7.169* 04/30/2013 1054   PCO2ART 84.4* 05/13/2013 1054   PO2ART 437.0* 04/22/2013 1054   HCO3 30.7* 05/06/2013 1054   TCO2 33 05/06/2013 1054   ACIDBASEDEF 1.0 05/04/2013 1037   O2SAT 100.0 05/11/2013 1054   Lab Results  Component Value Date   WBC 12.9* 05/21/2013   HGB 12.8*  05/06/2013   HCT 40.9 05/17/2013   MCV 104.6* 05/17/2013   PLT 132* 05/17/2013   No results found for this basename: INR,  in the last 72 hours No results found for this basename: NA, K, CL, CO2, BUN, CREATININE, CALCIUM, LABALBU, PROT, BILITOT, ALKPHOS, ALT, AST, GLUCOSE, ALBUMIN,  in the last 168 hours No results found for this basename: mg    Recent Labs  05/11/2013 1026  TROPONINI <0.30   Pro B Natriuretic peptide (BNP)  Date/Time Value Range Status  05/06/2013 10:26 AM 407.4  0 - 450 pg/mL Final    Echo: October 2014,  Study Conclusions Left ventricle: The cavity size was normal. Wall thickness was increased in a pattern of mild LVH. Systolic function was normal. The estimated ejection fraction was in the range of 55% to 60%. Although no diagnostic regional wall motion abnormality was identified, this possibility cannot be completely excluded on the basis of this study.  ECG:  Atrial fib, no ST elevation  Radiology:  Dg Chest Port 1 View  05/14/2013   CLINICAL DATA:  Orogastric tube placement  EXAM: PORTABLE CHEST - 1 VIEW  COMPARISON:  Portable exam 1025 hr repeated at 1026 hr without priors for comparison  FINDINGS: Artifacts from trauma board, external pacing leads and multiple EKG leads.  A tube is identified at the cervical region into the upper thorax, question endotracheal versus large-bore orogastric tube, tip at the level of the aortic arch above carina.  Normal heart size, mediastinal contours, and pulmonary vascularity.  Lungs are emphysematous but clear.  No gross pleural effusion or pneumothorax.  Gaseous distention of stomach noted.  IMPRESSION: Limited exam due to extensive superimposed artifacts.  Question endotracheal versus large-bore orogastric tube with tip projecting at the level aortic arch, above carina.  No acute infiltrate.  Gaseous distention of stomach.   Electronically Signed   By: Lavonia Dana M.D.   On: 05/21/2013 10:48    ASSESSMENT AND PLAN:   The  patient was seen today by Dr. Sallyanne Kuster, the patient evaluated and the data reviewed.  Active Problems: CPR - temporary pacemaker inserted, no STEMI by ECG, EF previously normal by echo October 2014. Will cycle enzymes, check echo, follow HR. MD advise further evaluation.  Signed: Rosaria Ferries, PA-C 04/25/2013 11:41 AM Beeper [phone]  I have seen and examined the patient along with Rosaria Ferries, PA-C.  I have reviewed the chart, notes and new data.  I agree with PA's note.  History form chart and family-patient is unresponsive. Agonal rhythm on arrival did show some improvement with metabolic interventions (hyperventilation, calcium, bicarbonate) suggesting it was related to hypoxia-hypercapnia/acidosis/electrolyte abnormality. No reliable capture with cutaneous pacing. Rhythm  was unstable, temporary pacemaker wire introduced emergently via L IJ approach at bedside.  PLAN: He has multiple risk factors for ischemic heart disease, but at this point the bulk of evidence points away from that as the etiology. Limited bedside echo showed preserved EF - will get formal evaluation and wait ofr labs. The major concern is the high likelihood of neurological sequelae from long down time.  If rhythm stabilizes and a non-cardiac cause of PEA/asystole becomes evident, suggest early removal of transvenous pacemaker wire.  Sanda Klein, MD, [place] [phone] 26-May-2013, 12:13 PM

## 2013-05-23 NOTE — ED Notes (Addendum)
A-line placed

## 2013-05-23 NOTE — ED Notes (Signed)
Cardiology and PCCM at the bedside.

## 2013-05-23 NOTE — Progress Notes (Signed)
Chaplain offered support to pt's family as they experienced anticipatory grief.  Chaplain escorted to consultation A and  visited with them there.  Chaplain offered further support through sharing gifts of compassion and empathic listening and by getting beverages from the nourishment center.  Chaplain also prayed with the family in consultation A and at pt's bedside.  Family was appreciative of chaplain's support.   04/28/2013 1600  Clinical Encounter Type  Visited With Family;Patient and family together  Visit Type Spiritual support;ED  Spiritual Encounters  Spiritual Needs Prayer;Emotional  Stress Factors  Family Stress Factors Health changes    Estelle June, chaplain, pager [phone]

## 2013-05-23 NOTE — Progress Notes (Signed)
Patient seen and examined, agree with above note.  I dictated the care and orders written for this patient under my direction.  Wesam G Yacoub, MD [phone] 

## 2013-05-23 NOTE — Procedures (Signed)
Central Venous Catheter Insertion Procedure Note MAZE CORNIEL [id number] 01/01/1927  Procedure: Insertion of Central Venous Catheter Indications: Assessment of intravascular volume, Drug and/or fluid administration and Frequent blood sampling  Procedure Details Consent: Unable to obtain consent because of emergent medical necessity. Time Out: Verified patient identification, verified procedure, site/side was marked, verified correct patient position, special equipment/implants available, medications/allergies/relevent history reviewed, required imaging and test results available.  Performed  Maximum sterile technique was used including antiseptics, cap, gloves, gown, hand hygiene, mask and sheet. Skin prep: Chlorhexidine; local anesthetic administered A antimicrobial bonded/coated triple lumen catheter was placed in the right internal jugular vein using the Seldinger technique. Ultrasound guidance used.yes Catheter placed to 17 cm. Blood aspirated via all 3 ports and then flushed x 3. Line sutured x 2 and dressing applied.  Evaluation Blood flow good Complications: No apparent complications Patient did tolerate procedure well. Chest X-ray ordered to verify placement.  CXR: normal. By. Jerilynn Mages. Carolan Shiver [place] ACNP Maryanna Shape PCCM Pager [phone] till 3 pm If no answer page [phone] 05/09/2013, 12:12 PM  Emergent [place] gudiance  Daniel J. Titus Mould, MD, [place] Pgr: [facility]

## 2013-05-23 NOTE — ED Notes (Signed)
PCCM at the bedside to place [place]

## 2013-05-23 NOTE — Code Documentation (Signed)
Pulse check, rhythm noted.

## 2013-05-23 NOTE — Care Management Note (Signed)
    Page 1 of 1   May 21, 2013     2:03:05 PM   CARE MANAGEMENT NOTE 05-21-13  Patient:  [name], [name]   Account Number:  [id number]  Date Initiated:  21-May-2013  Documentation initiated by:  Elissa Hefty  Subjective/Objective Assessment:   adm w arrest, vent     Action/Plan:   lives w wife   Anticipated DC Date:     Anticipated DC Plan:           Choice offered to / List presented to:             Status of service:   Medicare Important Message given?   (If response is "NO", the following Medicare IM given date fields will be blank) Date Medicare IM given:   Date Additional Medicare IM given:    Discharge Disposition:    Per UR Regulation:  Reviewed for med. necessity/level of care/duration of stay  If discussed at [place] of Stay Meetings, dates discussed:    Comments:

## 2013-05-23 NOTE — Telephone Encounter (Signed)
[email or web address].Daveon Arpino, CMA  

## 2013-05-23 NOTE — ED Notes (Signed)
Dopamine started at 28mcg

## 2013-05-23 NOTE — Clinical Social Work Note (Signed)
CSW consult to pt and family regarding getting an emergency message to pt.'s granddaughter who is in the [employer] regarding pt.'s disposition. CSW gained permission from pt.'s wife to call [employer] in order to contact family member. [employer] called and information given. [employer] will contact family with any further information.Family appreciative of CSW services and involvement.    [address] [place]

## 2013-05-23 DEATH — deceased

## 2013-10-13 ENCOUNTER — Ambulatory Visit: Payer: BC Managed Care – PPO | Admitting: Neurology

## 2014-12-03 IMAGING — CR DG ABD PORTABLE 2V
2 series · 2 of 2 positions shown · non-contrast
Comparison: None.

CLINICAL DATA: Abdominal distention and constipation for 7 days.

EXAM:
PORTABLE ABDOMEN - 2 VIEW

[ap lld]
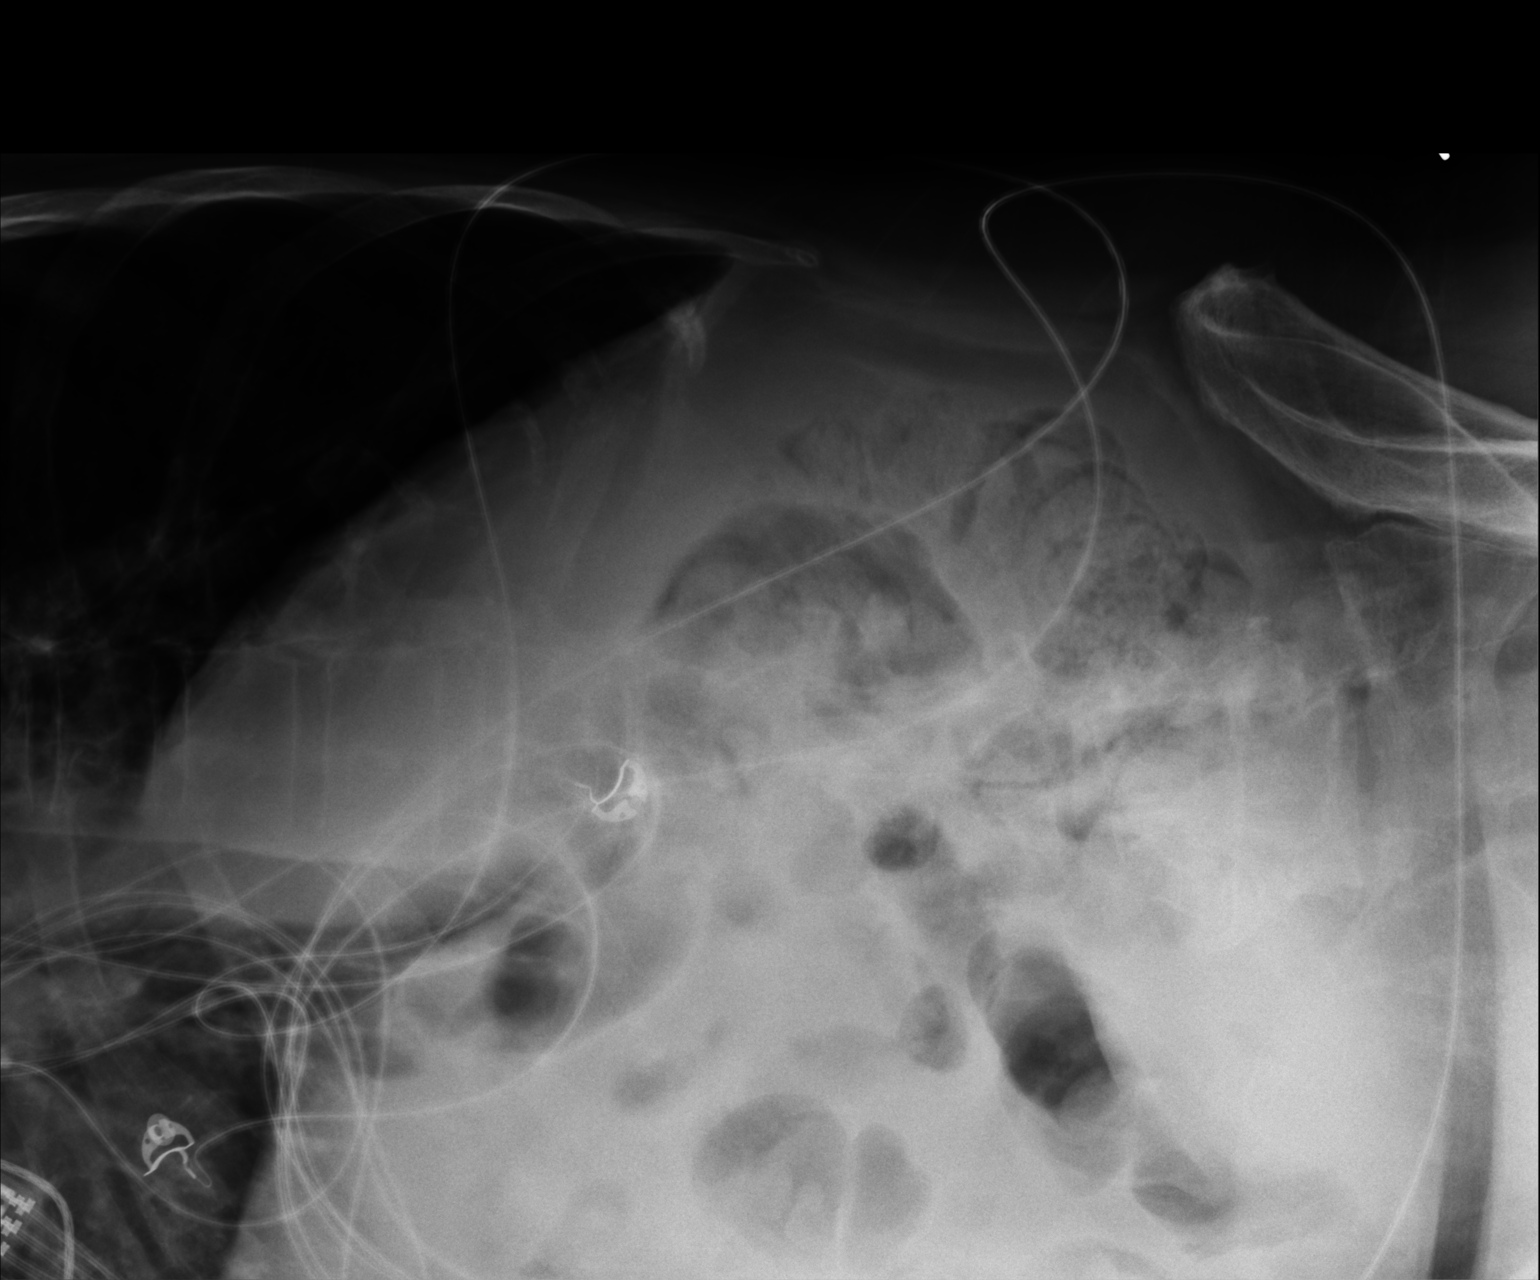

[AP]
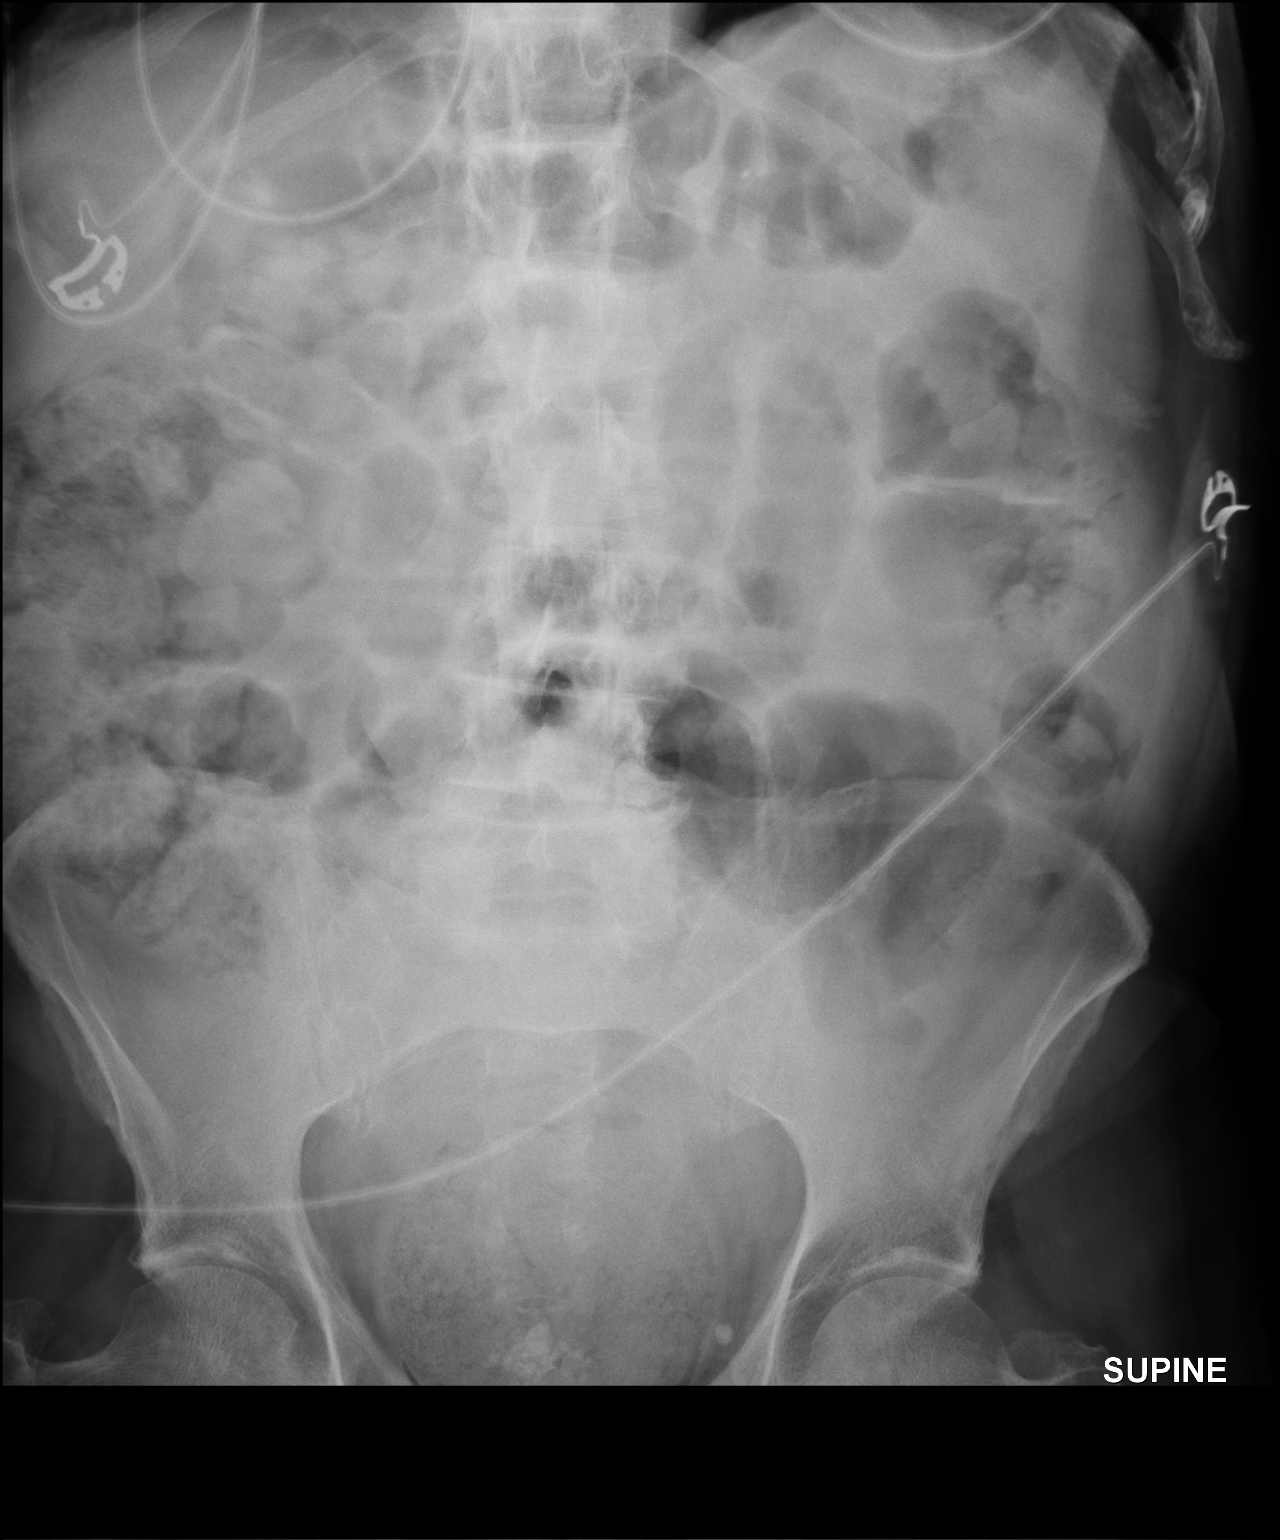

[2 of 2 positions shown; findings below may reference images not displayed]

FINDINGS: There is no free intraperitoneal air or evidence of bowel
obstruction. The patient has a very large volume of stool throughout
the colon including a large stool ball in the rectum. No abnormal
abdominal calcification is seen. No focal bony abnormality.
IMPRESSION: No acute finding.

Constipation with a large stool ball in the rectum compatible with
fecal impaction.

## 2014-12-03 IMAGING — CR DG CHEST 1V PORT
1 series · 1 of 1 positions shown · non-contrast
Comparison: PA and lateral chest 11/22/2010.

CLINICAL DATA: Generalized weakness.

EXAM:
PORTABLE CHEST - 1 VIEW

[AP]
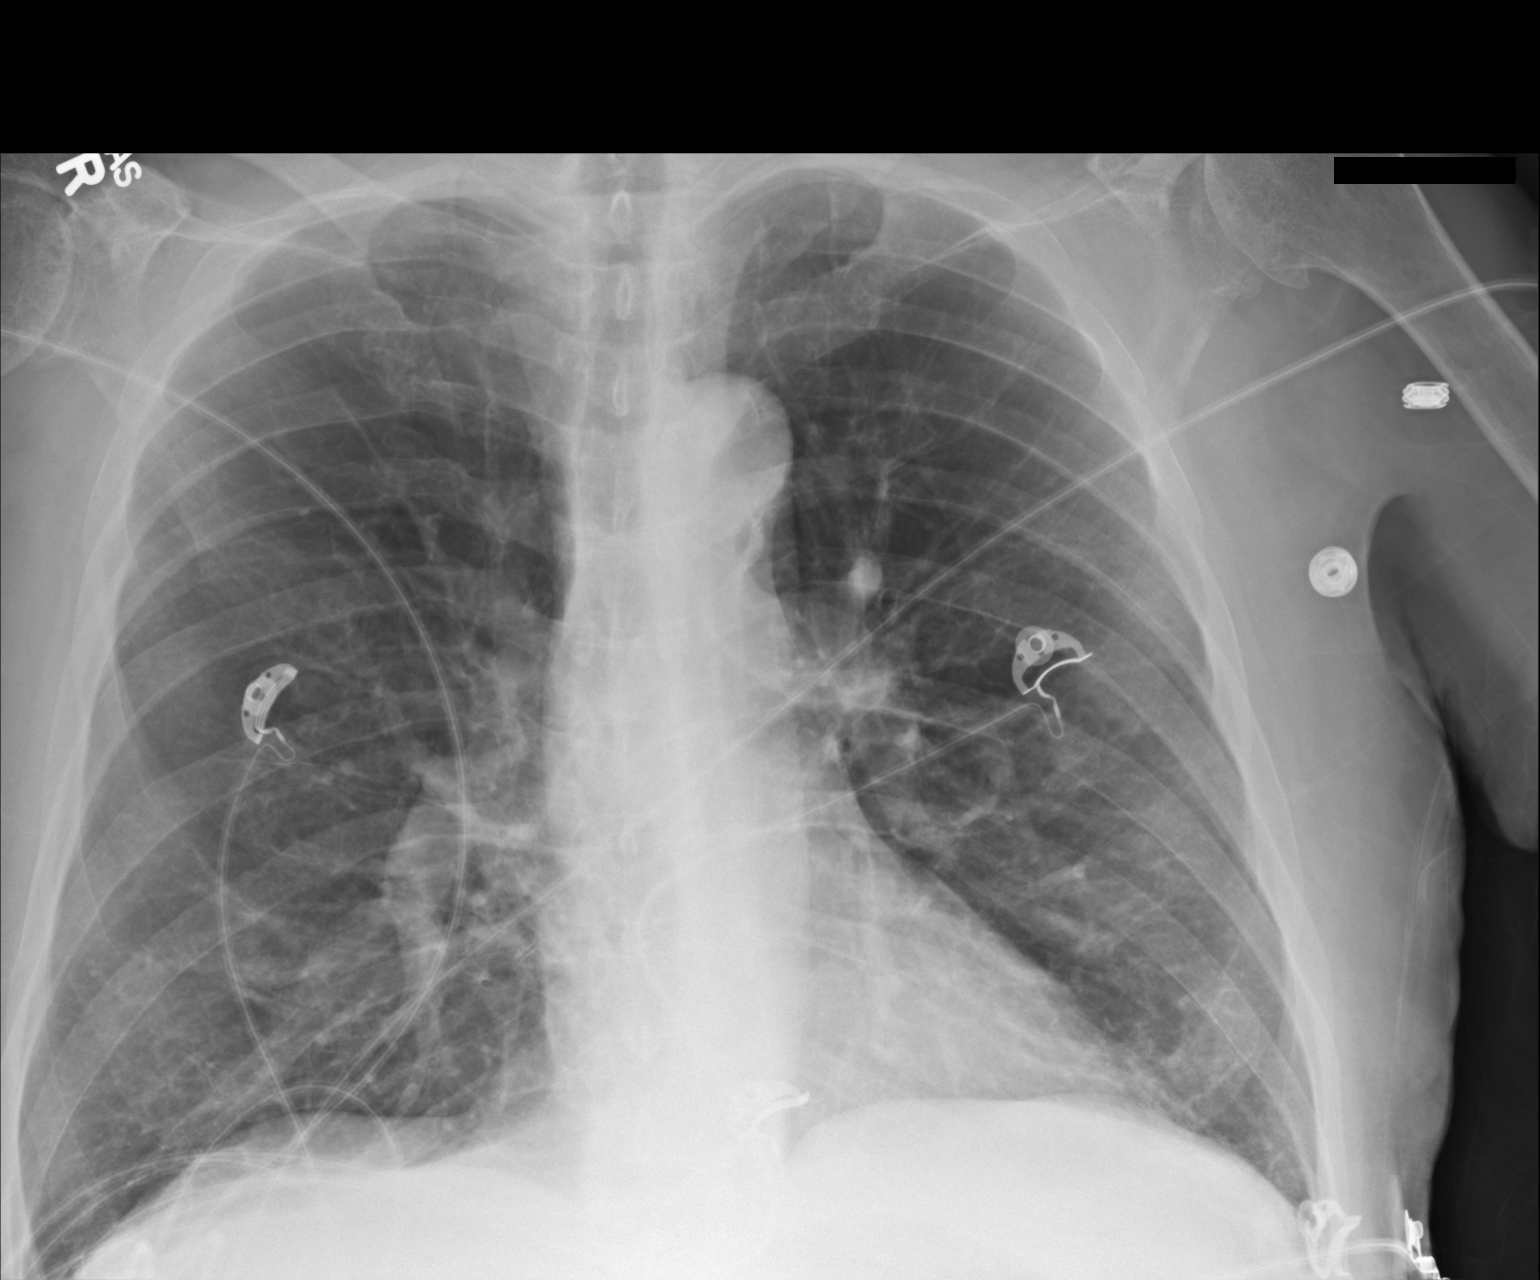

[1 of 1 positions shown; findings below may reference images not displayed]

FINDINGS: The lungs are emphysematous but clear. Heart size is normal. No
pneumothorax or pleural fluid. Degenerative change about the left
shoulder noted.
IMPRESSION: Emphysema without acute disease.

## 2015-01-28 IMAGING — CT CT HEAD W/O CM
2 series · 15 of 30 positions shown, 19 images · non-contrast
Comparison: None.

CLINICAL DATA: Altered mental status and syncope, no history of
recent trauma

EXAM:
CT HEAD WITHOUT CONTRAST
TECHNIQUE: Contiguous axial images were obtained from the base of the skull
through the vertex without intravenous contrast.

[Series 2: head w/o · axial · non-contrast · 0.49mm/px · z∈[+84,+224]mm · 13 of 34 slices shown, 17 images]
[im 3/34  brain]
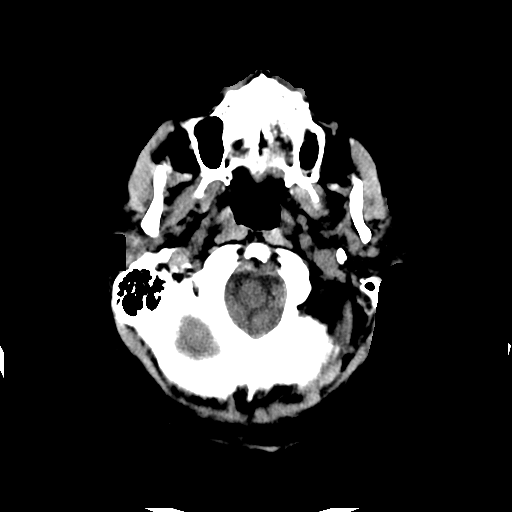
[im 3/34  bone]
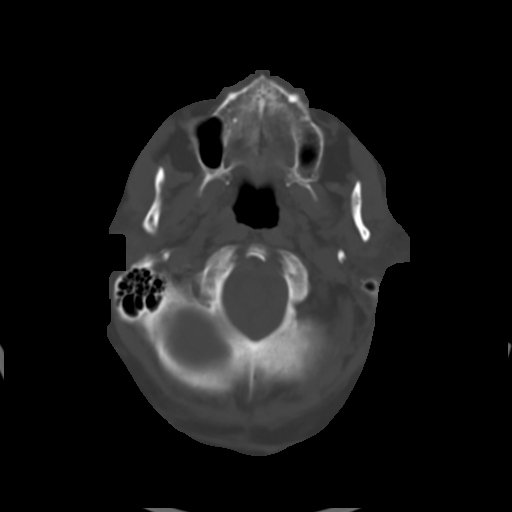
[im 5/34  brain]
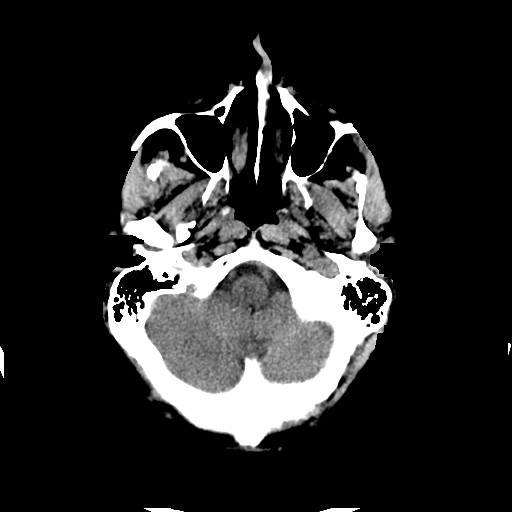
[im 8/34  brain]
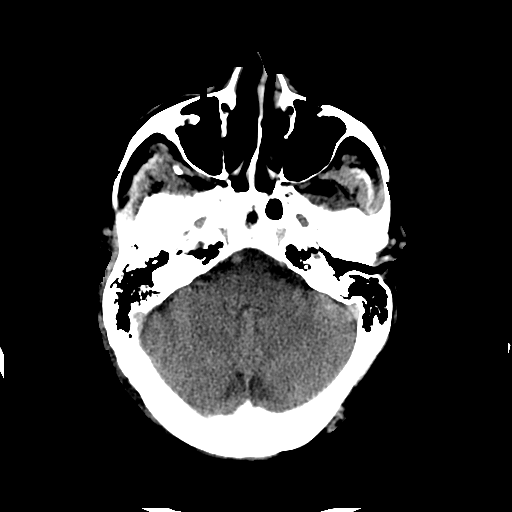
[im 10/34  brain]
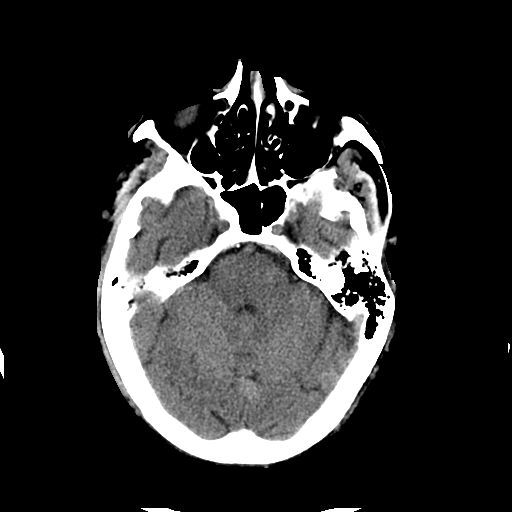
[im 12/34  brain]
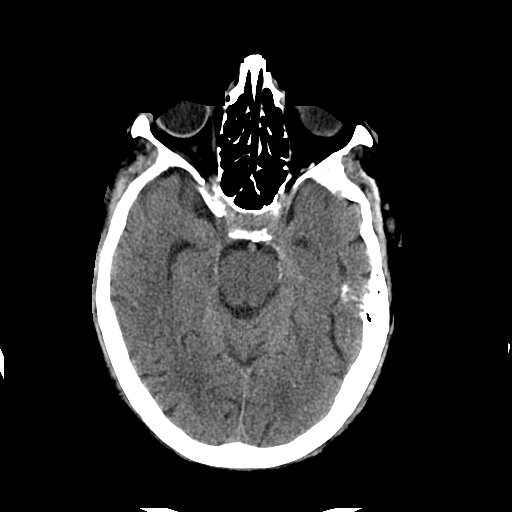
[im 12/34  bone]
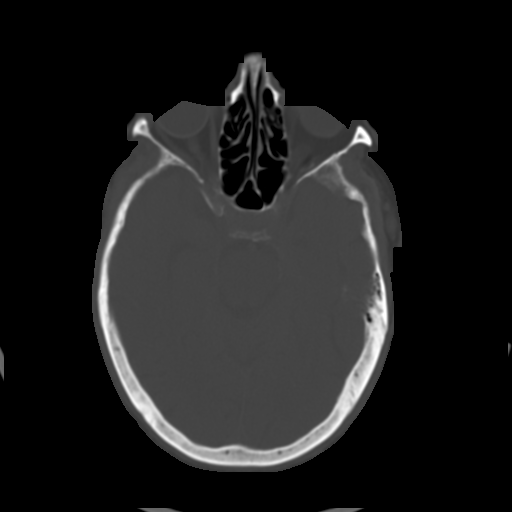
[im 15/34  brain]
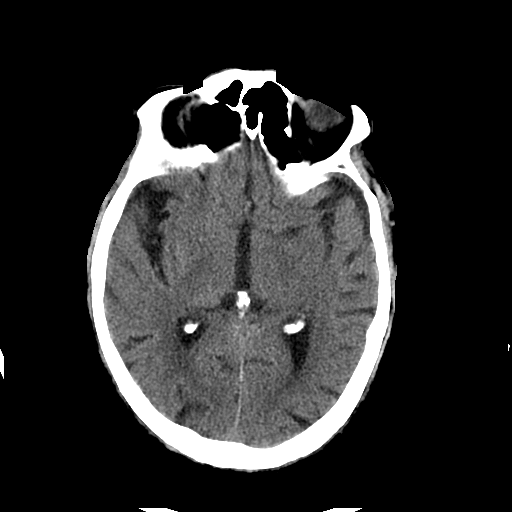
[im 17/34  brain]
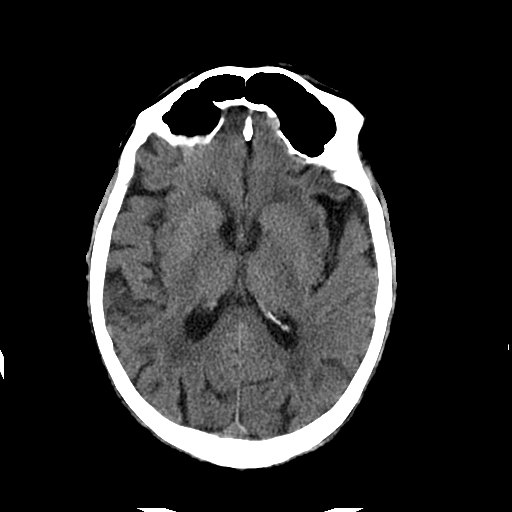
[im 19/34  brain]
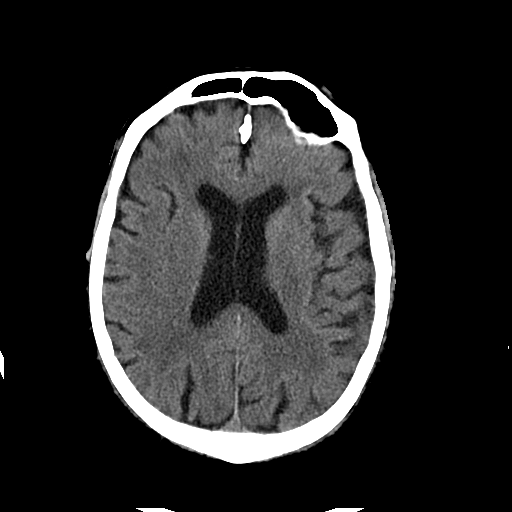
[im 22/34  brain]
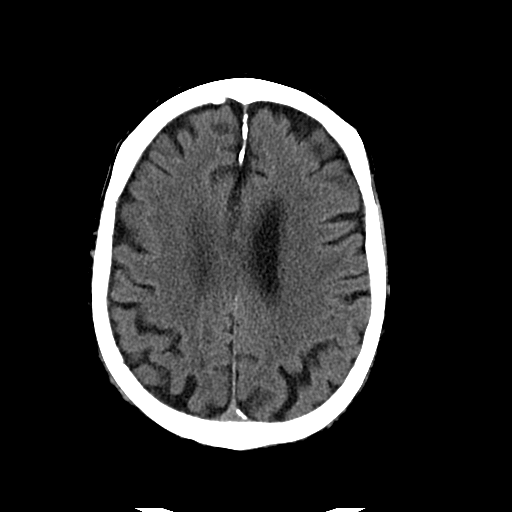
[im 22/34  bone]
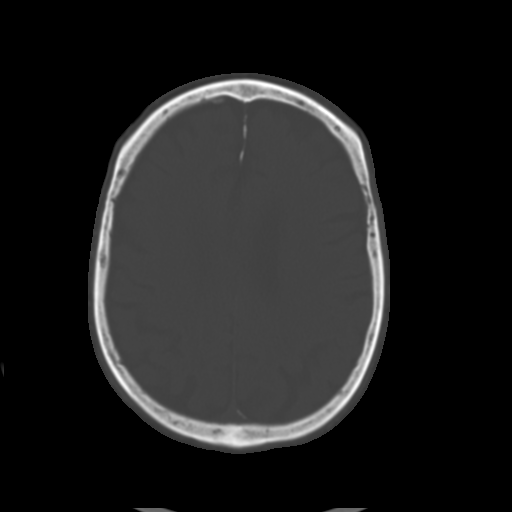
[im 24/34  brain]
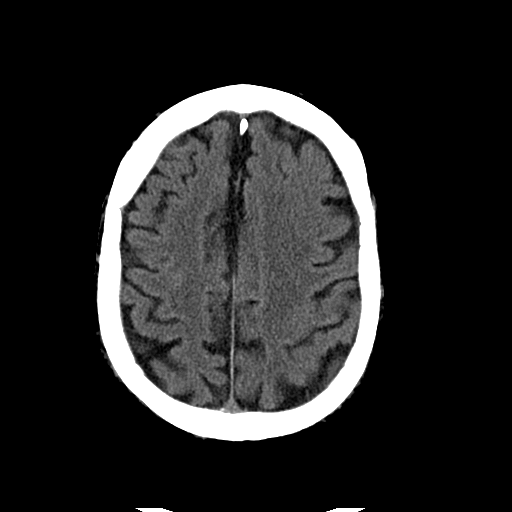
[im 26/34  brain]
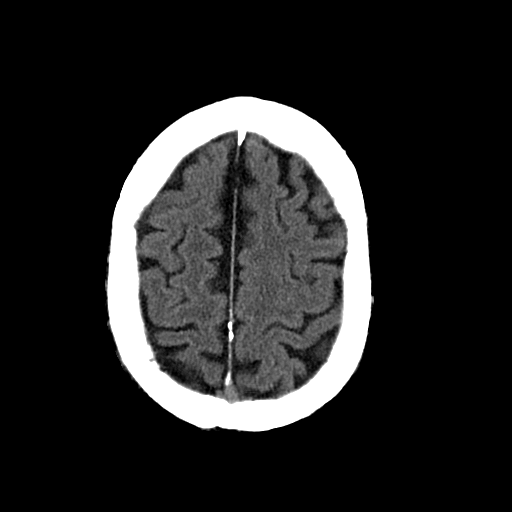
[im 29/34  brain]
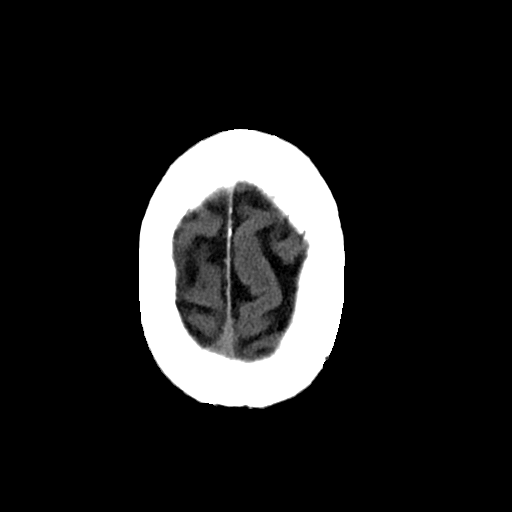
[im 31/34  brain]
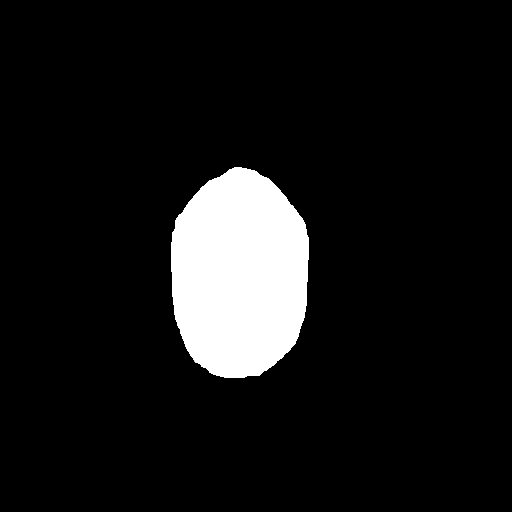
[im 31/34  bone]
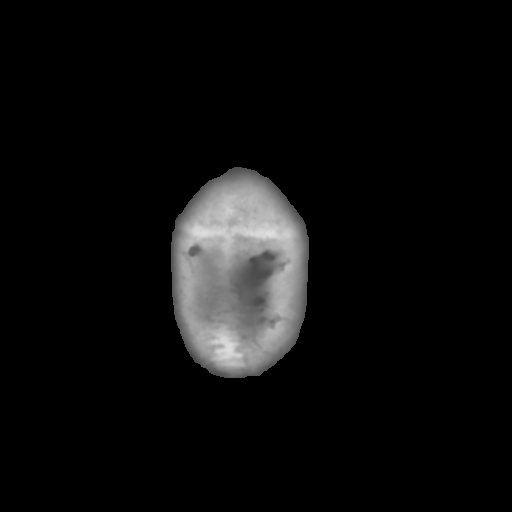

[Series 3: head w/o bone · axial · non-contrast · 0.49mm/px · z∈[+84,+110]mm · 2 of 34 slices shown]
[im 3/34  bone]
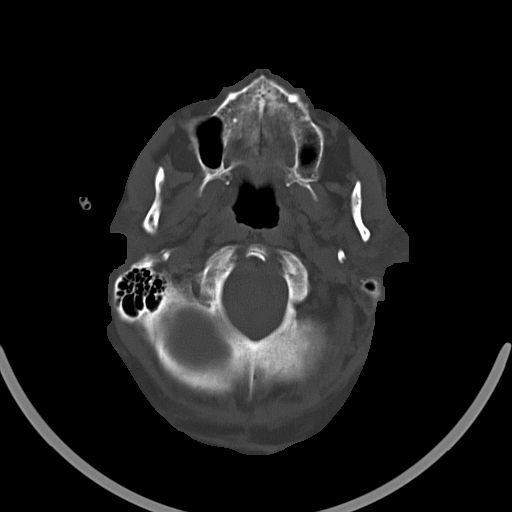
[im 8/34  bone]
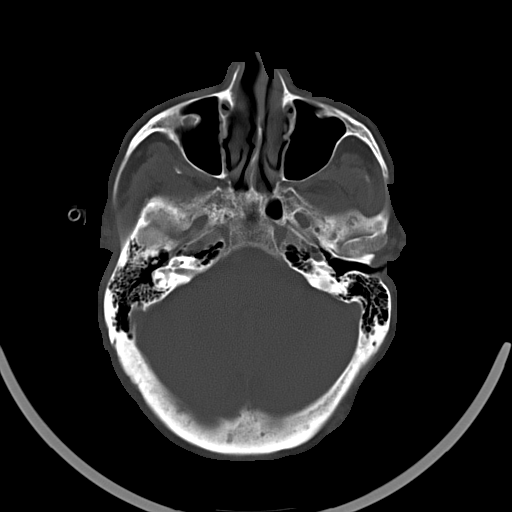

[15 of 30 positions shown; findings below may reference images not displayed]

FINDINGS: There is mild diffuse cerebral atrophy with compensatory
ventriculomegaly. There is no shift of the midline. There is no
evidence of an acute intracranial hemorrhage nor of an evolving
ischemic infarction. There are no abnormal intracranial
calcifications. The cerebellum and brainstem are normal in density.

At bone window settings the observed portions of the paranasal
sinuses and mastoid air cells are clear. There is no evidence of an
acute skull fracture.
IMPRESSION: 1. There is no evidence of an acute ischemic or hemorrhagic
infarction.
2. There is no intracranial mass effect or hydrocephalus.
3. There is mild age appropriate diffuse cerebral atrophy with mild
compensatory ventriculomegaly. These findings appear stable since an
MRI of the brain January 2006.
